# Patient Record
Sex: Female | Born: 1960
Health system: Southern US, Community
[De-identification: ages and names within clinical notes are randomized; demographics above are authoritative.]

## PROBLEM LIST (undated history)

## (undated) DIAGNOSIS — N951 Menopausal and female climacteric states: Secondary | ICD-10-CM

## (undated) DIAGNOSIS — E538 Deficiency of other specified B group vitamins: Secondary | ICD-10-CM

## (undated) DIAGNOSIS — E038 Other specified hypothyroidism: Secondary | ICD-10-CM

## (undated) DIAGNOSIS — K5909 Other constipation: Secondary | ICD-10-CM

## (undated) DIAGNOSIS — E039 Hypothyroidism, unspecified: Secondary | ICD-10-CM

## (undated) DIAGNOSIS — R002 Palpitations: Secondary | ICD-10-CM

## (undated) DIAGNOSIS — M7580 Other shoulder lesions, unspecified shoulder: Secondary | ICD-10-CM

## (undated) DIAGNOSIS — Z803 Family history of malignant neoplasm of breast: Secondary | ICD-10-CM

## (undated) DIAGNOSIS — K219 Gastro-esophageal reflux disease without esophagitis: Secondary | ICD-10-CM

## (undated) DIAGNOSIS — M722 Plantar fascial fibromatosis: Secondary | ICD-10-CM

## (undated) DIAGNOSIS — E785 Hyperlipidemia, unspecified: Secondary | ICD-10-CM

## (undated) DIAGNOSIS — Z8481 Family history of carrier of genetic disease: Secondary | ICD-10-CM

## (undated) HISTORY — DX: Morbid (severe) obesity due to excess calories: E66.01

## (undated) HISTORY — PX: TONSILLECTOMY: SHX5217

## (undated) HISTORY — DX: Palpitations: R00.2

## (undated) HISTORY — DX: Menopausal and female climacteric states: N95.1

## (undated) HISTORY — DX: Gastro-esophageal reflux disease without esophagitis: K21.9

## (undated) HISTORY — DX: Family history of carrier of genetic disease: Z84.81

## (undated) HISTORY — DX: Hypothyroidism, unspecified: E03.9

## (undated) HISTORY — DX: Deficiency of other specified B group vitamins: E53.8

## (undated) HISTORY — DX: Other constipation: K59.09

## (undated) HISTORY — DX: Plantar fascial fibromatosis: M72.2

## (undated) HISTORY — DX: Other shoulder lesions, unspecified shoulder: M75.80

## (undated) HISTORY — DX: Other specified hypothyroidism: E03.8

## (undated) HISTORY — DX: Hyperlipidemia, unspecified: E78.5

## (undated) HISTORY — PX: COLONOSCOPY: SHX174

## (undated) HISTORY — DX: Family history of malignant neoplasm of breast: Z80.3

---

## 1999-09-04 ENCOUNTER — Other Ambulatory Visit: Admission: RE | Admit: 1999-09-04 | Discharge: 1999-09-04 | Payer: Self-pay | Admitting: Obstetrics and Gynecology

## 2000-09-24 ENCOUNTER — Other Ambulatory Visit: Admission: RE | Admit: 2000-09-24 | Discharge: 2000-09-24 | Payer: Self-pay | Admitting: Obstetrics and Gynecology

## 2001-02-04 ENCOUNTER — Ambulatory Visit (HOSPITAL_COMMUNITY): Admission: RE | Admit: 2001-02-04 | Discharge: 2001-02-04 | Payer: Self-pay | Admitting: Gastroenterology

## 2001-02-04 ENCOUNTER — Encounter: Payer: Self-pay | Admitting: Gastroenterology

## 2001-02-24 ENCOUNTER — Encounter (INDEPENDENT_AMBULATORY_CARE_PROVIDER_SITE_OTHER): Payer: Self-pay | Admitting: *Deleted

## 2001-02-24 ENCOUNTER — Ambulatory Visit (HOSPITAL_COMMUNITY): Admission: RE | Admit: 2001-02-24 | Discharge: 2001-02-24 | Payer: Self-pay | Admitting: Gastroenterology

## 2001-02-24 LAB — HM COLONOSCOPY

## 2001-09-27 ENCOUNTER — Other Ambulatory Visit: Admission: RE | Admit: 2001-09-27 | Discharge: 2001-09-27 | Payer: Self-pay | Admitting: Obstetrics and Gynecology

## 2002-04-05 ENCOUNTER — Observation Stay (HOSPITAL_COMMUNITY): Admission: RE | Admit: 2002-04-05 | Discharge: 2002-04-05 | Payer: Self-pay | Admitting: Surgery

## 2002-04-05 ENCOUNTER — Encounter (INDEPENDENT_AMBULATORY_CARE_PROVIDER_SITE_OTHER): Payer: Self-pay

## 2002-04-05 HISTORY — PX: CHOLECYSTECTOMY: SHX55

## 2002-08-16 ENCOUNTER — Encounter: Payer: Self-pay | Admitting: Obstetrics and Gynecology

## 2002-08-16 ENCOUNTER — Encounter: Admission: RE | Admit: 2002-08-16 | Discharge: 2002-08-16 | Payer: Self-pay | Admitting: Obstetrics and Gynecology

## 2002-10-12 ENCOUNTER — Other Ambulatory Visit: Admission: RE | Admit: 2002-10-12 | Discharge: 2002-10-12 | Payer: Self-pay | Admitting: Obstetrics and Gynecology

## 2003-08-18 ENCOUNTER — Encounter: Payer: Self-pay | Admitting: Obstetrics and Gynecology

## 2003-08-18 ENCOUNTER — Encounter: Admission: RE | Admit: 2003-08-18 | Discharge: 2003-08-18 | Payer: Self-pay | Admitting: Obstetrics and Gynecology

## 2003-10-27 ENCOUNTER — Other Ambulatory Visit: Admission: RE | Admit: 2003-10-27 | Discharge: 2003-10-27 | Payer: Self-pay | Admitting: Obstetrics and Gynecology

## 2004-06-11 ENCOUNTER — Encounter: Admission: RE | Admit: 2004-06-11 | Discharge: 2004-06-11 | Payer: Self-pay | Admitting: Gastroenterology

## 2004-09-12 ENCOUNTER — Encounter: Admission: RE | Admit: 2004-09-12 | Discharge: 2004-09-12 | Payer: Self-pay | Admitting: Obstetrics and Gynecology

## 2004-11-05 ENCOUNTER — Other Ambulatory Visit: Admission: RE | Admit: 2004-11-05 | Discharge: 2004-11-05 | Payer: Self-pay | Admitting: Obstetrics and Gynecology

## 2004-12-09 ENCOUNTER — Ambulatory Visit: Admission: RE | Admit: 2004-12-09 | Discharge: 2004-12-09 | Payer: Self-pay | Admitting: Obstetrics and Gynecology

## 2004-12-09 ENCOUNTER — Encounter (INDEPENDENT_AMBULATORY_CARE_PROVIDER_SITE_OTHER): Payer: Self-pay | Admitting: *Deleted

## 2004-12-09 HISTORY — PX: SALPINGOOPHORECTOMY: SHX82

## 2005-10-03 ENCOUNTER — Encounter: Admission: RE | Admit: 2005-10-03 | Discharge: 2005-10-03 | Payer: Self-pay | Admitting: Obstetrics and Gynecology

## 2005-11-07 ENCOUNTER — Other Ambulatory Visit: Admission: RE | Admit: 2005-11-07 | Discharge: 2005-11-07 | Payer: Self-pay | Admitting: Obstetrics and Gynecology

## 2006-05-26 ENCOUNTER — Inpatient Hospital Stay (HOSPITAL_COMMUNITY): Admission: RE | Admit: 2006-05-26 | Discharge: 2006-05-28 | Payer: Self-pay | Admitting: Obstetrics and Gynecology

## 2006-05-26 ENCOUNTER — Encounter (INDEPENDENT_AMBULATORY_CARE_PROVIDER_SITE_OTHER): Payer: Self-pay | Admitting: *Deleted

## 2006-05-26 HISTORY — PX: SALPINGOOPHORECTOMY: SHX82

## 2006-05-26 HISTORY — PX: ABDOMINAL HYSTERECTOMY: SHX81

## 2006-10-12 ENCOUNTER — Encounter: Admission: RE | Admit: 2006-10-12 | Discharge: 2006-10-12 | Payer: Self-pay | Admitting: Obstetrics and Gynecology

## 2007-10-20 ENCOUNTER — Encounter: Admission: RE | Admit: 2007-10-20 | Discharge: 2007-10-20 | Payer: Self-pay | Admitting: Obstetrics and Gynecology

## 2008-02-21 ENCOUNTER — Encounter: Admission: RE | Admit: 2008-02-21 | Discharge: 2008-02-21 | Payer: Self-pay | Admitting: Gastroenterology

## 2008-11-10 DIAGNOSIS — E538 Deficiency of other specified B group vitamins: Secondary | ICD-10-CM

## 2008-11-10 HISTORY — DX: Deficiency of other specified B group vitamins: E53.8

## 2009-05-29 ENCOUNTER — Encounter: Admission: RE | Admit: 2009-05-29 | Discharge: 2009-05-29 | Payer: Self-pay | Admitting: Obstetrics and Gynecology

## 2010-06-07 ENCOUNTER — Encounter: Admission: RE | Admit: 2010-06-07 | Discharge: 2010-06-07 | Payer: Self-pay | Admitting: Obstetrics and Gynecology

## 2010-06-10 LAB — HM PAP SMEAR: HM Pap smear: NORMAL

## 2010-07-10 ENCOUNTER — Ambulatory Visit: Payer: Self-pay | Admitting: Cardiovascular Disease

## 2010-09-06 ENCOUNTER — Ambulatory Visit: Payer: Self-pay | Admitting: Cardiovascular Disease

## 2010-09-17 ENCOUNTER — Ambulatory Visit (HOSPITAL_COMMUNITY): Admission: RE | Admit: 2010-09-17 | Discharge: 2010-09-17 | Payer: Self-pay | Admitting: Cardiovascular Disease

## 2010-09-17 ENCOUNTER — Ambulatory Visit: Payer: Self-pay

## 2010-09-17 ENCOUNTER — Encounter: Payer: Self-pay | Admitting: Cardiovascular Disease

## 2010-09-17 ENCOUNTER — Ambulatory Visit: Payer: Self-pay | Admitting: Cardiology

## 2011-03-28 NOTE — Op Note (Signed)
[facility]  Patient:    Jodi [name], Jodi [name] Visit Number: [id number] MRN: [id number]          Service Type: SUR Location: 3W [id number] [id number] Attending Physician:  Shelly Rubenstein Dictated by:   Abigail Miyamoto, M.D. Proc. Date: 04/05/02 Admit Date:  04/05/2002   CC:         Anselmo Rod, M.D.   Operative Report  PREOPERATIVE DIAGNOSIS:  Chronic abdominal pain.  POSTOPERATIVE DIAGNOSIS:  Chronic cholecystitis.  PROCEDURE:  Laparoscopic cholecystectomy.  SURGEON:  Douglas A. Magnus Ivan, M.D.  ASSISTANT:  Zigmund Daniel, M.D.  ANESTHESIA:  General endotracheal.  INDICATIONS:  Jodi [name] is a 50 year old female, who has had a several year history of right upper quadrant abdominal pain, which is made worse with fatty meals.  She has had an extensive work-up by a gastroenterologist, including ultrasound, HIDA scan, upper endoscopy, and CAT scan which were all unremarkable.  Secondary to her symptoms, the decision was made to proceed to the operating room for laparoscopy.  FINDINGS:  The patient was found to have a chronically infected gallbladder with multiple adhesions to it consistent with chronic cholecystitis.  PROCEDURE IN DETAIL:  The patient was brought to the operating room and identified as Jodi [name].  She was placed supine on the operating room table, and general anesthesia was induced.  Her abdomen was then prepped and draped in the usual sterile fashion.  Using a #15 blade, a small transverse incision was made below the umbilicus.  The incision was carried down to the fascia which was then opened with a scalpel.  A hemostat was then used to pass through the peritoneal cavity.  A 0 Vicryl pursestring suture was then placed around the fascial opening.  The Hasson port was placed through the opening, and insufflation of the abdomen was begun.  An 11 mm port was placed in the patients epigastrium, and two 5 mm ports were placed in  the patients right flank under direct vision.  The gallbladder was then retracted above the liver bed.  The patient was found to have multiple adhesions to the base of the gallbladder which were taken down with the electrocautery.  The gallbladder was found to be quite scarred and thick-walled.  The cystic duct and artery was then dissected out.  The artery was grossly adherent to the cystic stump and could not be peeled off.  Therefore, both were clipped together with three clips proximally and two clips distally and then transected with the scissors. Hemostasis appeared to be achieved.  The gallbladder was then dissected free from the liver bed with the electrocautery.  Once the gallbladder was removed, again the liver bed was examined, and hemostasis was achieved.  The gallbladder was then removed through the incision at the umbilicus.  The 0 Vicryl at the umbilicus was tied in place, closing the fascial defect.  The abdomen was then irrigated with normal saline.  Hemostasis again was achieved. All ports were then removed under direct vision, and the abdomen was deflated. All skin incisions were then anesthetized with 0.25% Marcaine and then closed with 4-0 Monocryl subcuticular sutures.  Steri-Strips, gauze, and tape were then applied.  The patient tolerated the procedure well.  All sponge, needle, and instrument counts were correct at the end of the procedure.  The patient was then extubated in the operating room and taken in stable condition to the recovery room. Dictated by:   Abigail Miyamoto, M.D. Attending Physician:  Shelly Rubenstein  DD:  04/05/02 TD:  04/05/02 Job: [id number]

## 2011-03-28 NOTE — Op Note (Signed)
NAME:  Jodi [name], Jodi [name]               ACCOUNT NO.:  [id number]   MEDICAL RECORD NO.:  [id number]          PATIENT TYPE:  INP   LOCATION:  9399                          FACILITY:  [facility]   PHYSICIAN:  Carrington Clamp, M.D. DATE OF BIRTH:  July 13, 1961   DATE OF PROCEDURE:  12/09/2004  DATE OF DISCHARGE:                                 OPERATIVE REPORT   PREOPERATIVE DIAGNOSIS:  Left complex ovarian cyst.   POSTOPERATIVE DIAGNOSIS:  Left corpus luteum cyst, hydrosalpinx,  endometriosis, and adhesions of epiploica to the left ovary.   PROCEDURE:  Diagnostic laparoscopy, with left salpingo-oophorectomy, cautery  of endometriosis, lysis of adhesions.   SURGEON:  Carrington Clamp, M.D.   ASSISTANT:  Luvenia Redden, M.D.   ANESTHESIA:  General.   SPECIMENS:  Left ovary and tube.   ESTIMATED BLOOD LOSS:  100 cc.   IV FLUIDS:  2,000 cc.   URINE OUTPUT:  450 cc.   COMPLICATIONS:  None.   FINDINGS:  A 3 cm left corpus luteum cyst with clear fluid noted on entry.  Left tube had hydrosalpinx and clubbing.  There is endometriosis in the left  cul-de-sac.  There was a normal right tube and ovary and otherwise normal  uterus.   MEDICATIONS:  Intercede.   COUNTS:  Correct x 3.   TECHNIQUE:  After adequate general anesthesia was achieved, the patient was  prepped and draped in the usual sterile fashion in the dorsal lithotomy  position.  A speculum was placed in the vagina, and a single-tooth tenaculum  placed on the cervix.  The Kahn cannula was placed into the cervical os and  the speculum removed.  Attention was then turned to the abdomen, where a 2  cm infraumbilical incision was made with a scalpel and then the Veress  needle passed through this without aspiration of bowel contents or blood.  The abdomen was insufflated and the 10 mm trocar placed.  Three 5 mm trocars  were then placed, one each bilaterally and one suprapubically, avoiding the  inferior epigastric arteries and  bladder.  The bladder had been drained with  a Foley catheter.  The above findings were noted.  The epiploica of the  sigmoid were grossly adhesed to the left ovarian cyst, and it was very  difficult to identify the infundibulopelvic ligament.  Endoshears were  passed into the abdomen, and Endoshears were used to carefully dissect the  epiploica off of the ovary.  The ovary was entered into at this point and  clear fluid noted.  The dissection was then carried toward the tube which  was then dissected off of the infundibulopelvic ligament.  Blunt and sharp  dissection were used both to identify these structures, and on the right  hand side the ureter was identified to be well below the infundibulopelvic  ligament, although it was difficult to identify the ureter on the left hand  side.  I believed that it was well out of the field of dissection.  In doing  the dissection, we entered the retroperitoneal space anyway, and it was  clear that the ureter was  not in that superficial area where we were  dissecting.   Once the infundibulopelvic ligament and the ovary were clear, the tube was  dissected with the triple polar cautery, following the mesosalpinx all the  way to the end.  The ovary was then removed by cauterizing the uteroovarian  ligament and again following the mesovarium all the way to the  infundibulopelvic ligament.  The infundibulopelvic ligament was cauterized  very medially as to avoid any pelvic sidewall structures, and the ovary was  able to be removed.  After the dissection had been carried out, hemostasis  was achieved, and again the ureter was not anywhere near the field of  dissection, nor was the bowel or any of the other pelvic sidewall  structures.   The left ovary and tube were then placed in an Endo bag and removed through  the 10 mm scope site.  Endometriosis was noted in the cul-de-sac, one area  on the broad ligament which was cauterized superficially, one area  close to  the uterosacral ligament which was cauterized superficially.  The other one  appeared to be too close to the area that the ureter would traverse and  therefore was left alone.  There was a small amount of oozing from the edges  of the dissection of the epiploica, and one area was briefly tented up and  cauterized to prevent any more bleeding.  A small piece of Intercede was  placed over this to ensure hemostasis and also to prevent any adhesions of  the small bowel to the sigmoid.  The pelvis had been irrigated vigorously  and then emptied of the irrigation.  All instruments were then withdrawn  from the abdomen and the abdomen desufflated.  The 10 mm trocar site fascia  was closed with a figure-of-eight stitch of 2-0 Vicryl.  The suprapubic skin  incisions were closed with subcuticular single stitches of 3-0 Vicryl  Rapide.  The incisions in the abdomen were closed with Dermabond.  All  instruments were withdrawn from the vagina, and a small amount of silver  nitrate was applied to the cervix to stem some bleeding from one of the  tenaculum sites.  The patient tolerated the procedure well and was returned  to the recovery room in stable condition.      MH/MEDQ  D:  12/09/2004  T:  12/09/2004  Job:  [id number]

## 2011-03-28 NOTE — Procedures (Signed)
[place]. [facility]  Patient:    Jodi [name], Jodi [name]                      MRN: [id number] Proc. Date: 02/24/01 Adm. Date:  [id number] Attending:  Charna Elizabeth CC:         Teena Irani. Arlyce Dice, M.D.                           Procedure Report  DATE OF BIRTH:  June 21, 1961  REFERRING PHYSICIAN:  Teena Irani. Arlyce Dice, M.D.  PROCEDURE PERFORMED:  Colonoscopy.  ENDOSCOPIST:  Anselmo Rod, M.D.  INSTRUMENT USED:  Olympus video colonoscope.  INDICATIONS FOR PROCEDURE:  Guaiac positive stool in a 50 year old female rule out colonic polyps, masses, hemorrhoids, etc.  PREPROCEDURE PREPARATION:  Informed consent was procured from the patient. The patient was fasted for eight hours prior to the procedure and prepped with a bottle of magnesium citrate and a gallon of NuLytely the night prior to the procedure.  PREPROCEDURE PHYSICAL:  The patient had stable vital signs.  Neck supple. Chest clear to auscultation.  S1, S2 regular.  Abdomen soft with normal abdominal bowel sounds.  DESCRIPTION OF PROCEDURE:  The patient was placed in the left lateral decubitus position and sedated with 20 mg of Demerol and 2 mg of Versed intravenously in addition to the medications she received for upper endoscopy. Once the patient was adequately sedated and maintained on low-flow oxygen and continuous cardiac monitoring, the Olympus video colonoscope was advanced from the rectum to the cecum without difficulty.  Except for small internal and external hemorrhoids, no other abnormalities were noticed up to the cecum and terminal ileum.  The entire colonic mucosa appeared healthy with normal vascular pattern.  No erosions, ulcerations, diverticula or other abnormalities were noticed.  The terminal ileum also appeared healthy.  IMPRESSION:  Healthy-appearing colon and terminal ileum except for small nonbleeding internal and external hemorrhoids.  RECOMMENDATIONS: 1. The patient has  been advised to increase the fluid and fiber in the diet. 2. Repeat guaiac testing will be done on an outpatient basis and further    recommendations made as needed.DD:  02/24/01 TD:  02/25/01 Job: [id number]

## 2011-03-28 NOTE — Discharge Summary (Signed)
NAMELUBERTHA, Jodi [name] NO.:  [id number]   MEDICAL RECORD NO.:  [id number]          PATIENT TYPE:  INP   LOCATION:  9312                          FACILITY:  [facility]   PHYSICIAN:  Carrington Clamp, M.D. DATE OF BIRTH:  1960-11-12   DATE OF ADMISSION:  05/26/2006  DATE OF DISCHARGE:  05/28/2006                                 DISCHARGE SUMMARY   ADMISSION DIAGNOSIS:  Left lower quadrant pain and possible ovarian mass.   DISCHARGE DIAGNOSIS:  Pelvic adhesions and endometriosis.   PROCEDURE:  Total abdominal hysterectomy with right salpingo-oophorectomy.   LABORATORY DATA:  Preoperative H&H 13 and 38.  Postoperative H&H 10 and 31.   HISTORY OF PRESENT ILLNESS:  Please refer to dictated history and physical  on chart.  Briefly, this is a 50 year old G3, P2 status post left  oophorectomy complaining of left lower quadrant pain and mass on left.   HOSPITAL COURSE:  Ms. Overholt was admitted on May 22, 2006 for the above-  named procedures.  Findings were 8-week size uterus with endometriosis and  adhesions.  There was no mass of the ovary seen at all.  The patient  underwent the operation without complication and was discharged on  postoperative day #2.   ACTIVITY:  Walk with assistance.  No driving for two weeks.  No lifting for  six weeks.  No sexual activity for six weeks.   DIET:  Increase water and fiber.   WOUND CARE:  Keep incision clean and dry.   DISCHARGE MEDICATIONS:  1. Darvocet-N 100 one p.o. q.4-6h. p.r.n. pain.  2. Tylenol over-the-counter.  3. Colace over-the-counter.  4. Mylanta Gas over-the-counter.   FOLLOW UP:  The patient was to follow up the following day for staples to be  removed in the office.      Carrington Clamp, M.D.  Electronically Signed     MH/MEDQ  D:  07/02/2006  T:  07/02/2006  Job:  [id number]

## 2011-03-28 NOTE — Op Note (Signed)
[place]. [facility]  Patient:    Jodi [name], Jodi [name]                      MRN: [id number] Proc. Date: 02/24/01 Adm. Date:  [id number] Attending:  Charna Elizabeth CC:         Teena Irani. Arlyce Dice, M.D.                           Operative Report  DATE OF BIRTH:  November 17, 1960  REFERRING PHYSICIAN:  Teena Irani. Arlyce Dice, M.D.  PROCEDURE PERFORMED:  Esophagogastroduodenoscopy with biopsies.  ENDOSCOPIST:  Anselmo Rod, M.D.  INSTRUMENT USED:  Olympus video panendoscope.  INDICATIONS FOR PROCEDURE:  A 50 year old white female with a history of epigastric pain and guaiac positive stools rule out peptic ulcer disease, esophagitis, gastritis, etc.  PREPROCEDURE PREPARATION:  Informed consent was procured from the patient. The patient was fasted for eight hours prior to the procedure.  PREPROCEDURE PHYSICAL:  The patient had stable vital signs.  Neck supple. Chest clear to auscultation.  S1, S2 regular.  Abdomen soft with normal abdominal bowel sounds.  DESCRIPTION OF PROCEDURE:  The patient was placed in left lateral decubitus position and sedated with 40 mg of Demerol and 6 mg of Versed intravenously. Once the patient was adequately sedated and maintained on low-flow oxygen and continuous cardiac monitoring, the Olympus video panendoscope was advanced through the mouthpiece, over the tongue, into the esophagus under direct vision.  The distal esophagus had a small patch of salmon-pink mucosa above the Z-line.  This area was biopsied to rule out Barretts by pathology.  The rest of the esophageal mucosa appeared healthy.  There was a small hiatal hernia seen on high retroflexion.  The rest of the gastric mucosa and the proximal small bowel appeared normal.  IMPRESSION: 1. Small patch of what appears to be Barretts mucosa in the distal esophagus.    Biopsies done, results pending. 2. Small hiatal hernia. 3. Normal-appearing stomach and proximal small  bowel.  RECOMMENDATIONS: 1. Await pathology results. 2. Continue proton pump inhibitors for now. 3. Avoid all nonsteroidals. 4. Outpatient follow-up in the next four weeks. DD:  02/24/01 TD:  02/25/01 Job: [id number]

## 2011-03-28 NOTE — Op Note (Signed)
NAMESELIN, [name] NO.:  [id number]   MEDICAL RECORD NO.:  [id number]          PATIENT TYPE:  INP   LOCATION:  9399                          FACILITY:  [facility]   PHYSICIAN:  Carrington Clamp, M.D. DATE OF BIRTH:  1961-01-07   DATE OF PROCEDURE:  05/26/2006  DATE OF DISCHARGE:                                 OPERATIVE REPORT   PREOPERATIVE DIAGNOSIS:  Left lower quadrant pain and possible left lower  quadrant mass.   POSTOPERATIVE DIAGNOSIS:  Adhesions and endometriosis.   PROCEDURE:  Total abdominal hysterectomy and right salpingo-oophorectomy.   SURGEON:  Carrington Clamp, M.D.   ASSISTANT:  Luvenia Redden, M.D.   ANESTHESIA:  General.   SPECIMENS:  Uterus, cervix and right ovary and tube.   ESTIMATED BLOOD LOSS:  200 mL.   IV FLUIDS:  1500 mL.   COMPLICATIONS:  None.   FINDINGS:  Eight weeks size uterus, small products of endometriosis were  seen along the uterus, the vesicouterine fascia, the anterior uterus, and  along the broad ligament.  There was no masses on the left hand side.  There  were some adhesions of the bowel to the peritoneum but otherwise no other  problems.  Right ovary and tube looked normal. Uterus was, otherwise,  normal.   MEDICATIONS:  Ancef.   COUNTS:  Correct x3.   TECHNIQUE:  After adequate general anesthesia was achieved, the patient was  prepped and draped in the usual sterile fashion in the dorsal supine  position.  A Pfannenstiel skin incision was made with the scalpel and  carried down to the fascia with Bovie cautery.  The fascia was incised in  the midline with the scalpel and carried in a transverse curvilinear manner  with the Mayo scissors.  The rectus muscles were split in the midline and  the bowel free portion of peritoneum was entered into with blunt and sharp  dissection.  The peritoneum was then incised in a superior and inferior  manner with Metzenbaum scissors with good visualization of bowel and  the  bladder.   The O'Connor-O'Sullivan retractor was placed and the bowel was packed away  with four wet laps.  Systematic inspection of the pelvis was then undertaken  and the above findings noted.  The cornua of the uterus was then grasped  with [employer] clamps.  The round ligaments were secured with a suture  transfixion stitch of 0 Vicryl and then divided with Bovie cautery.  Bovie  cautery was then used to incise the peritoneum superiorly perpendicular to  the infundibular pelvic ligament and inferiorly with creating the bladder  flap by incising the vesicouterine fascia.  The bladder flap was created  with blunt and sharp dissection and was eventually removed out of the field  with blunt dissection.  The infundibular pelvic ligaments were then isolated  and each grasped with a Heaney clamp.  Each pedicle was incised with the  Mayo scissors and then secured with a free hand tie of 0 Vicryl followed by  a stitch of 0 Vicryl.  The uterine arteries were then skeletonized and  Heaney  clamps were placed bilaterally at the level of the internal os.  Each  pedicle was incised with the scalpel and then secured with a stitch of 0  Vicryl.   Alternating successive bites of the Ballantine clamp was used to divide the  cardinal ligament.  Each pedicle was incised with the scalpel and then  secured with a stitch of 0 Vicryl.  The final bite on the left-hand side  actually got into the vagina at the level of the reflection of the vagina  and the cervix.  The cervix was grasped with the Kocher and then the uterine  specimen amputated with the Jorgenson scissors.  This had been done after  the ureters were seen coursing bilaterally outside the field of dissection  on both the right and left hand sides on multiple times.  The angles of the  cuff was then secured with a Heaney stitch of 0 Vicryl.  The cuff was closed  with interrupted figure-of-eight stitches of 0 Vicryl.  The ureters were  again  reinspected and found to be out of the field of dissection and  peristalsing bilaterally.   Irrigation was performed and all instruments were then withdrawn from the  abdomen.  The peritoneum was closed with a running stitch of 2-0 Vicryl.  The fascia was closed with a running stitch of 0 PDS looped.  The  subcutaneous tissue was rendered hemostatic with Bovie cautery and  irrigation.  The subcutaneous layer was closed with interrupted stitches of  2-0 plain gut.  The skin was closed staples.  The small amount of  endometriosis that had not been removed were in areas that could be  cauterized and were safely done so.  The pelvis was, otherwise, clear and  free of any masses.  The patient tolerated the procedure well and is taken  to the recovery room in stable condition.      Carrington Clamp, M.D.  Electronically Signed     MH/MEDQ  D:  05/26/2006  T:  05/26/2006  Job:  [id number]

## 2011-03-28 NOTE — H&P (Signed)
NAME:  Jodi [name], Jodi [name] NO.:  [id number]   MEDICAL RECORD NO.:  [id number]          PATIENT TYPE:  AMB   LOCATION:  SDC                           [facility]ACILITY:  [facility]   PHYSICIAN:  Carrington Clamp, M.D. DATE OF BIRTH:  08-20-1961   DATE OF ADMISSION:  05/26/2006  DATE OF DISCHARGE:                                HISTORY & PHYSICAL   CHIEF COMPLAINT:  This is a 50 year old G3, P2, status post left  oophorectomy, complaining of left lower quadrant pain and mass on left.   HISTORY OF PRESENT ILLNESS:  The patient presented to me in January of 2006  complaining of left lower quadrant pain and bleeding.  She underwent an  ultrasound that revealed a 3-cm complex cyst on the left and was taken for a  laparoscopic evaluation in January 2006.  She underwent a left salpingo-  oophorectomy and pathology was read out as hemorrhagic corpus luteum cyst.  The patient presented again in May 2007 complaining of a lot of pain on the  left-hand side again.  She continues to have heavy bleeding and severe pain  with urination.  The patient has had difficulty taking nonsteroidals in the  past secondary to a hiatal hernia and reflux and the pain was worsening.  Gas pain made it worse, felt like sharp squeezing and it was intermittent.  Bimanual exam at that time revealed an 8- to 9-week-size uterus with knobbly  cervix, but unable to feel any masses distinctly.  Ultrasound revealed  another mass on the left measuring 3.4 cm; it appears to be the left ovary.  The right ovary was otherwise normal in appearance and the uterus size was  10 x 6 x 5.  All risks, benefits and options were discussed with the patient  and we decided to proceed with total abdominal hysterectomy and bilateral  salpingo-oophorectomy.  The patient will have CA125, CA19.9 and CEA drawn at  preoperative labs.  However, given the benign nature of the mass and the  benign nature of the previous mass, it is unlikely that this  is cancer and  therefore we will proceed with a regular hysterectomy.  The patient will  receive a modified bowel prep before the surgery in order to help clear the  bowels.   PAST MEDICAL HISTORY:  Significant only for reflux.   PAST SURGICAL HISTORY:  1.  The patient has had her tonsils and gallbladder out.  2.  She also underwent the diagnostic laparoscopy with left salpingo-      oophorectomy in January 2006.   PAST OBSTETRICAL HISTORY:  Term spontaneous vaginal delivery x2.   PAST GYNECOLOGICAL HISTORY:  Negative for sexually transmitted diseases or  pelvic infections.   SOCIAL HISTORY:  Tobacco:  None.   MEDICATIONS:  1.  Protonix 40 mg one p.o. daily.  2.  Colace 100 mg b.i.d.   ALLERGIES:  No known drug allergies.   PHYSICAL EXAMINATION:  GENERAL:  Physical exam reveals a normal-appearing  female with no lymphadenopathy and anicteric.  NECK:  Without thyromegaly.  LUNGS:  Clear to auscultation bilaterally.  HEART:  Regular rate and rhythm.  ABDOMEN:  Soft, nontender and non-distended.  PELVIC:  Bimanual exam revealed 8- to 9-week-size uterus, anteverted, with a  possible mass on the left, but no distinct masses felt.  Normal external  genitalia and vagina and vault were otherwise seen.   ASSESSMENT:  This is a 50 year old gravida 3, para 2 with a recurrent left  mass on ultrasound and persistent left lower quadrant pain.  The patient  desires definitive therapy.  She will undergo an exploratory laparotomy with  a total abdominal hysterectomy and uni or bilateral salpingo-oophorectomy.  She will undergo a modified bowel prep preoperatively in order to clear the  bowels in the event that there are significant adhesions.  The patient will  receive preoperative antibiotics and sequential compression devices during  the procedure.  All risks, benefits and options have been discussed with the  patient.  The CA125 and CA19.9 and carcinoembryonic antigen were all pending   at the time of this dictation.  The patient wishes to proceed and  understands.      Carrington Clamp, M.D.  Electronically Signed     MH/MEDQ  D:  05/26/2006  T:  05/26/2006  Job:  [id number]

## 2011-06-09 ENCOUNTER — Encounter: Payer: Self-pay | Admitting: Family Medicine

## 2011-06-12 ENCOUNTER — Encounter: Payer: Self-pay | Admitting: Family Medicine

## 2011-06-12 ENCOUNTER — Ambulatory Visit (INDEPENDENT_AMBULATORY_CARE_PROVIDER_SITE_OTHER): Payer: 59 | Admitting: Family Medicine

## 2011-06-12 ENCOUNTER — Telehealth: Payer: Self-pay | Admitting: Family Medicine

## 2011-06-12 DIAGNOSIS — E538 Deficiency of other specified B group vitamins: Secondary | ICD-10-CM

## 2011-06-12 DIAGNOSIS — Z0000 Encounter for general adult medical examination without abnormal findings: Secondary | ICD-10-CM | POA: Insufficient documentation

## 2011-06-12 LAB — CBC WITH DIFFERENTIAL/PLATELET
Basophils Absolute: 0 K/uL (ref 0.0–0.1)
Basophils Relative: 0.9 % (ref 0.0–3.0)
Eosinophils Absolute: 0.1 K/uL (ref 0.0–0.7)
Eosinophils Relative: 1 % (ref 0.0–5.0)
HCT: 38.8 % (ref 36.0–46.0)
Hemoglobin: 13.1 g/dL (ref 12.0–15.0)
Lymphocytes Relative: 36.3 % (ref 12.0–46.0)
Lymphs Abs: 2 K/uL (ref 0.7–4.0)
MCHC: 33.6 g/dL (ref 30.0–36.0)
MCV: 96.8 fl (ref 78.0–100.0)
Monocytes Absolute: 0.4 K/uL (ref 0.1–1.0)
Monocytes Relative: 7.3 % (ref 3.0–12.0)
Neutro Abs: 3 K/uL (ref 1.4–7.7)
Neutrophils Relative %: 54.5 % (ref 43.0–77.0)
Platelets: 195 K/uL (ref 150.0–400.0)
RBC: 4.01 Mil/uL (ref 3.87–5.11)
RDW: 13.4 % (ref 11.5–14.6)
WBC: 5.6 K/uL (ref 4.5–10.5)

## 2011-06-12 LAB — COMPREHENSIVE METABOLIC PANEL
Alkaline Phosphatase: 70 U/L (ref 39–117)
CO2: 28 mEq/L (ref 19–32)
Calcium: 9.2 mg/dL (ref 8.4–10.5)
Chloride: 100 mEq/L (ref 96–112)
GFR: 77.71 mL/min (ref >60.00)
Glucose, Bld: 94 mg/dL (ref 70–99)
Total Protein: 7.4 g/dL (ref 6.0–8.3)

## 2011-06-12 LAB — VITAMIN B12: Vitamin B-12: 447 pg/mL (ref 211–911)

## 2011-06-12 LAB — LIPID PANEL
Cholesterol: 272 mg/dL — ABNORMAL HIGH (ref 0–200)
HDL: 61.6 mg/dL
Total CHOL/HDL Ratio: 4
Triglycerides: 138 mg/dL (ref 0.0–149.0)
VLDL: 27.6 mg/dL (ref 0.0–40.0)

## 2011-06-12 LAB — LDL CHOLESTEROL, DIRECT: Direct LDL: 188.3 mg/dL

## 2011-06-12 NOTE — Telephone Encounter (Signed)
Pls request records from Silas [place], FNP (her old office in [place]).  Thx--PM

## 2011-06-12 NOTE — Assessment & Plan Note (Signed)
Check B12 level to see where she stands on oral replacement only for the last 54mo or so.

## 2011-06-12 NOTE — Telephone Encounter (Signed)
Faxed request

## 2011-06-12 NOTE — Patient Instructions (Signed)
Buy OTC senakot S (generic) and take 2 tabs every night for constipation.

## 2011-06-12 NOTE — Assessment & Plan Note (Addendum)
Obesity is her primary issue and she is definitely working hard on lifestyle mod. Will check FLP today to see where she stands after having been off of simvastatin for months (or more). Also, will check CBC, CMET, and TSH today. Her GYN care is managed by her GYN MD.  Last pap was 2011 and she still apparently gets these q3 yrs since having her uterus removed for nonmalignant reasons.  Mammogram is UTD.   Will obtain old records.

## 2011-06-12 NOTE — Progress Notes (Signed)
Office Note 06/12/2011  CC:  Chief Complaint  Patient presents with  . Annual Exam    establish, fasting labs    HPI:  Jodi [name] is a 50 y.o. White female who is here to establish care and get CPE. Patient's most recent primary MD: Silas Sacramento, FNP in [place], [place]. Old records in [facility] were reviewed prior to or during today's visit.  No acute complaints.  Reviewed history. Has been on vit B12 injections about 2 yrs but since about 01/2011 has been only on OTC po daily replacement b/c her primary care provider's office closed.   Also, was on simvastatin 20mg  qd for months in the past but says it persistently caused fatigue and diffuse myalgias and this resolved when she d/c'd the med.   Also, got echo and saw Dr. Elease Hashimoto for palpitations (says exclusively when supine or sitting reclined) and was rx'd propranolol 10mg  to take qid prn for this and has only had to take it rarely.  Echo was normal.  No other testing done per pt report.  Past Medical History  Diagnosis Date  . Hemorrhoids 02/24/01    Colon; Dr. Loreta Ave  . GERD (gastroesophageal reflux disease)   . Palpitations     ECHO 09/2010 normal.  Rarely has to take 10mg  propranolol prn palpitations.  Marland Kitchen GERD (gastroesophageal reflux disease)     w/ hiatal hernia.  EGD 2002 (Dr. Loreta Ave) normal biopsy of esophagus  . Hyperlipidemia     intolerant of simvastatin  . Vitamin B12 deficiency 2010    Past Surgical History  Procedure Date  . Cholecystectomy 04/05/02    lap chole  . Tonsillectomy   . Abdominal hysterectomy 05/26/06    (pathology benign)  . Salpingoophorectomy 05/26/06    Right w/hysterectomy (pathology benign)  . Salpingoophorectomy 12/09/04    Left (pathology benign)    Family History  Problem Relation Age of Onset  . Diabetes Mother   . Heart disease Mother     CABG  . Hyperlipidemia Mother   . Hypertension Mother   . Cancer Father     lung  . Alcohol abuse Father   . Heart disease Maternal Grandmother     . Hyperlipidemia Maternal Grandmother   . Hypertension Maternal Grandmother   . Stroke Maternal Grandmother   . Diabetes Maternal Grandmother     History   Social History  . Marital Status: Married    Spouse Name: N/A    Number of Children: N/A  . Years of Education: N/A   Occupational History  . Not on file.   Social History Main Topics  . Smoking status: Never Smoker   . Smokeless tobacco: Never Used  . Alcohol Use: Not on file  . Drug Use: Not on file  . Sexually Active: Not on file   Other Topics Concern  . 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  Walks 1-3 miles daily.    Outpatient Encounter Prescriptions as of 06/12/2011  Medication Sig Dispense Refill  . estradiol (ESTRACE) 0.5 MG tablet Take 1 tablet by mouth daily.      . Omega-3 Fatty Acids (FISH OIL) 1200 MG CAPS Take 1 capsule by mouth daily.        Maxwell Caul Bicarbonate (ZEGERID OTC) 20-1100 MG CAPS Take 1 capsule by mouth daily.        . propranolol (  INDERAL) 10 MG tablet Take 10 mg by mouth 4 (four) times daily. To be taken qid AS NEEDED for palpitations as per Dr. Elease Hashimoto.       . vitamin B-12 (CYANOCOBALAMIN) 500 MCG tablet Take 500 mcg by mouth daily.          Allergies  Allergen Reactions  . Codeine Other (See Comments)    Skin crawls    ROS Review of Systems  Constitutional: Negative for fever, chills, appetite change and fatigue.  HENT: Negative for ear pain, congestion, sore throat, neck stiffness and dental problem.   Eyes: Negative for discharge, redness and visual disturbance.  Respiratory: Negative for cough, chest tightness, shortness of breath and wheezing.   Cardiovascular: Negative for chest pain, palpitations and leg swelling.  Gastrointestinal: Negative for nausea, vomiting, abdominal pain, diarrhea and blood in stool.  Genitourinary: Negative for  dysuria, urgency, frequency, hematuria, flank pain and difficulty urinating.  Musculoskeletal: Negative for myalgias, back pain, joint swelling and arthralgias.  Skin: Negative for pallor and rash.  Neurological: Negative for dizziness, speech difficulty, weakness and headaches.  Hematological: Negative for adenopathy. Does not bruise/bleed easily.  Psychiatric/Behavioral: Negative for confusion and sleep disturbance. The patient is not nervous/anxious.     PE; Blood pressure 123/83, pulse 66, height 5' 5.25" (1.657 m), weight 248 lb (112.492 kg), last menstrual period 05/10/2006, SpO2 98.00%. Gen: Alert, well appearing, overweight white female.  Patient is oriented to person, place, time, and situation. HEENT: Scalp without lesions or hair loss.  Ears: EACs clear, normal epithelium.  TMs with good light reflex and landmarks bilaterally.  Eyes: no injection, icteris, swelling, or exudate.  EOMI, PERRLA. Nose: no drainage or turbinate edema/swelling.  No injection or focal lesion.  Mouth: lips without lesion/swelling.  Oral mucosa pink and moist.  Dentition intact and without obvious caries or gingival swelling.  Oropharynx without erythema, exudate, or swelling.  Neck: supple, ROM full.  Carotids 2+ bilat, without bruit.  No lymphadenopathy, thyromegaly, or mass. Chest: symmetric expansion, nonlabored respirations.  Clear and equal breath sounds in all lung fields.   CV: RRR, no m/r/g.  Peripheral pulses 2+ and symmetric. ABD: soft, NT, ND, BS normal.  No hepatospenomegaly or mass.  No bruits. EXT: no clubbing, cyanosis, or edema.   Pertinent labs:  None today  ASSESSMENT AND PLAN:   Health maintenance examination Obesity is her primary issue and she is definitely working hard on lifestyle mod. Will check FLP today to see where she stands after having been off of simvastatin for months (or more). Also, will check CBC, CMET, and TSH today. Her GYN care is managed by her GYN MD.  Last pap  was 2011 and she still apparently gets these q3 yrs since having her uterus removed for nonmalignant reasons.  Mammogram is UTD.   Will obtain old records.  Vitamin B12 deficiency Check B12 level to see where she stands on oral replacement only for the last 43mo or so.     Return in about 1 year (around 06/11/2012).

## 2011-07-10 ENCOUNTER — Other Ambulatory Visit: Payer: Self-pay | Admitting: *Deleted

## 2011-07-10 ENCOUNTER — Other Ambulatory Visit: Payer: Self-pay | Admitting: Obstetrics and Gynecology

## 2011-07-10 NOTE — Telephone Encounter (Signed)
Faxed request received from pharmacy to refill simvastatin.  Pt restarted earlier this month.  I have attempted to contact this patient by phone with the following results: left message to return my call regarding simvastatin on answering machine (mobile).

## 2011-07-11 ENCOUNTER — Encounter: Payer: Self-pay | Admitting: Family Medicine

## 2011-07-11 MED ORDER — SIMVASTATIN 20 MG PO TABS: 20.0000 mg | ORAL_TABLET | Freq: Every day | ORAL | Status: DC

## 2011-07-11 NOTE — Telephone Encounter (Signed)
RC from pt.  She is doing well on simvastatin.  No side effects.  RX will be sent to pharm.

## 2011-11-14 ENCOUNTER — Telehealth: Payer: Self-pay | Admitting: *Deleted

## 2011-11-14 MED ORDER — SIMVASTATIN 20 MG PO TABS: 20.0000 mg | ORAL_TABLET | Freq: Every day | ORAL | Status: DC

## 2011-11-14 NOTE — Telephone Encounter (Signed)
New insurance, needs 90 day rx.  90 day supply sent.

## 2012-02-23 ENCOUNTER — Other Ambulatory Visit: Payer: Self-pay | Admitting: *Deleted

## 2012-02-23 MED ORDER — SIMVASTATIN 20 MG PO TABS: 20.0000 mg | ORAL_TABLET | Freq: Every day | ORAL | Status: DC

## 2012-02-23 NOTE — Telephone Encounter (Signed)
PC from patient requesting refill on simvastatin. Last filled by office 11/2011. Last seen 06/2011 Follow up needed 06/2012. RX sent to pharm.   Notified pt she will be due for CPE in August.  Encouraged to call for appt when she begins her last refill.  She is agreeable.

## 2012-04-07 ENCOUNTER — Encounter: Payer: Self-pay | Admitting: Family Medicine

## 2012-04-07 ENCOUNTER — Ambulatory Visit (INDEPENDENT_AMBULATORY_CARE_PROVIDER_SITE_OTHER): Payer: 59 | Admitting: Family Medicine

## 2012-04-07 VITALS — BP 129/87 | HR 59 | Temp 97.5°F | Ht 65.25 in | Wt 260.0 lb

## 2012-04-07 DIAGNOSIS — E669 Obesity, unspecified: Secondary | ICD-10-CM

## 2012-04-07 DIAGNOSIS — E785 Hyperlipidemia, unspecified: Secondary | ICD-10-CM

## 2012-04-07 DIAGNOSIS — E538 Deficiency of other specified B group vitamins: Secondary | ICD-10-CM

## 2012-04-07 DIAGNOSIS — I878 Other specified disorders of veins: Secondary | ICD-10-CM

## 2012-04-07 DIAGNOSIS — I872 Venous insufficiency (chronic) (peripheral): Secondary | ICD-10-CM

## 2012-04-07 LAB — CBC WITH DIFFERENTIAL/PLATELET
Basophils Absolute: 0 10*3/uL (ref 0.0–0.1)
Eosinophils Absolute: 0.1 10*3/uL (ref 0.0–0.7)
HCT: 36.6 % (ref 36.0–46.0)
Hemoglobin: 12.1 g/dL (ref 12.0–15.0)
Lymphs Abs: 2 10*3/uL (ref 0.7–4.0)
Monocytes Relative: 6.6 % (ref 3.0–12.0)
Neutro Abs: 3.1 10*3/uL (ref 1.4–7.7)
Platelets: 200 10*3/uL (ref 150.0–400.0)
RBC: 3.81 Mil/uL — ABNORMAL LOW (ref 3.87–5.11)
RDW: 13.6 % (ref 11.5–14.6)
WBC: 5.6 10*3/uL (ref 4.5–10.5)

## 2012-04-07 LAB — COMPREHENSIVE METABOLIC PANEL
AST: 23 U/L (ref 0–37)
Alkaline Phosphatase: 65 U/L (ref 39–117)
Calcium: 9.2 mg/dL (ref 8.4–10.5)
Chloride: 106 mEq/L (ref 96–112)
Creatinine, Ser: 0.8 mg/dL (ref 0.4–1.2)
GFR: 85.73 mL/min (ref >60.00)
Glucose, Bld: 91 mg/dL (ref 70–99)
Sodium: 141 mEq/L (ref 135–145)
Total Protein: 6.7 g/dL (ref 6.0–8.3)

## 2012-04-07 LAB — LIPID PANEL
Cholesterol: 209 mg/dL — ABNORMAL HIGH (ref 0–200)
HDL: 65.4 mg/dL (ref >39.00)
Triglycerides: 166 mg/dL — ABNORMAL HIGH (ref 0.0–149.0)
VLDL: 33.2 mg/dL (ref 0.0–40.0)

## 2012-04-07 LAB — TSH: TSH: 2.62 u[IU]/mL (ref 0.35–5.50)

## 2012-04-07 LAB — POCT URINALYSIS DIPSTICK: Bilirubin, UA: NEGATIVE

## 2012-04-07 LAB — VITAMIN B12: Vitamin B-12: 751 pg/mL (ref 211–911)

## 2012-04-07 MED ORDER — FUROSEMIDE 20 MG PO TABS: ORAL_TABLET | ORAL | Status: DC

## 2012-04-07 NOTE — Assessment & Plan Note (Addendum)
Venous insufficiency/lymphedema. Check UA for protein, check metabolic panel. Reassured pt of likely dx, general measures to counteract this noncurable condition. Low Na diet discussed, handout given, discussed occasional use of lasix when the legs feel the worst, elevation of legs prn, increase exercise.

## 2012-04-07 NOTE — Progress Notes (Signed)
OFFICE VISIT  04/07/2012   CC:  Chief Complaint  Patient presents with  . Foot Swelling    swelling in feet and ankles     HPI:    Patient is a 51 y.o. Caucasian female who presents for LE edema.  Last seen here 10 mo ago for CPE. Taking meds regularly. Wt is up 12 lb since last visit.   Noted onset of increased swelling in ankles and feet about 4 mo ago, gradual onset.  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  Worse at night, better when wakes up in A.m. Exercise: none Na intake: uses morton-light salt.  Does not otherwise watch Na intake.  Has felt that hands have shown a bit of fluid for a long time but never legs.  Denies SOB, DOE, CP, palpitations, or change in urine output. Denies prob from the statin we put her on after last lipid panel in august when LDL was 188.   Past Medical History  Diagnosis Date  . Hemorrhoids 02/24/01    Colonoscopy; Dr. Loreta Ave  . Palpitations     ECHO 09/2010 normal.  Rarely has to take 10mg  propranolol prn palpitations.  Marland Kitchen GERD (gastroesophageal reflux disease)     w/ hiatal hernia.  EGD 4/172002 (Dr. Loreta Ave) normal biopsy of esophagus  . Hyperlipidemia     intolerant of simvastatin  . Vitamin B12 deficiency 2010    IM therapy at one time but switched to oral in early 2012 and levels ok    Past Surgical History  Procedure Date  . Cholecystectomy 04/05/02    lap chole  . Tonsillectomy   . Abdominal hysterectomy 05/26/06    (pathology benign)  . Salpingoophorectomy 05/26/06    Right w/hysterectomy (pathology benign)  . Salpingoophorectomy 12/09/04    Left (pathology benign)    Outpatient Prescriptions Prior to Visit  Medication Sig Dispense Refill  . estradiol (ESTRACE) 0.5 MG tablet Take 1 tablet by mouth daily.      . Omega-3 Fatty Acids (FISH OIL) 1200 MG CAPS Take 1 capsule by mouth daily.        . simvastatin (ZOCOR) 20 MG tablet Take 1 tablet (20 mg total) by mouth at bedtime.  90 tablet  1  . vitamin  B-12 (CYANOCOBALAMIN) 500 MCG tablet Take 500 mcg by mouth daily.        Maxwell Caul Bicarbonate (ZEGERID OTC) 20-1100 MG CAPS Take 1 capsule by mouth daily.        . propranolol (INDERAL) 10 MG tablet Take 10 mg by mouth 4 (four) times daily. To be taken qid AS NEEDED for palpitations as per Dr. Elease Hashimoto.         Allergies  Allergen Reactions  . Codeine Other (See Comments)    Skin crawls    ROS As per HPI  PE: Blood pressure 129/87, pulse 59, temperature 97.5 F (36.4 C), temperature source Temporal, height 5' 5.25" (1.657 m), weight 260 lb (117.935 kg), last menstrual period 05/10/2006. Gen: Alert, well appearing.  Patient is oriented to person, place, time, and situation. Neck - No masses or thyromegaly or limitation in range of motion CV: RRR, no m/r/g.   LUNGS: CTA bilat, nonlabored resps, good aeration in all lung fields. ABD: soft, NT/ND but rotund EXT: doughy edema palpable in LE's from about mid tibial level down into feet bilat, no pitting, no tenderness or rash or varicosities.  No convincing pitting edema is found.  LABS:  UA today was normal except small blood.  IMPRESSION AND PLAN:  Lower extremity venous stasis Venous insufficiency/lymphedema. Check UA for protein, check metabolic panel. Reassured pt of likely dx, general measures to counteract this noncurable condition. Low Na diet discussed, handout given, discussed occasional use of lasix when the legs feel the worst, elevation of legs prn, increase exercise.  Hyperlipidemia Recheck fasting lipid panel today.  Vitamin B12 deficiency Check vit B12 level today.  Her level has been fine on oral supplement only.   CBC, TSH, and CMET done today as prep for upcoming CPE.  FOLLOW UP: Return for 2 mo for CPE.

## 2012-04-07 NOTE — Assessment & Plan Note (Signed)
Check vit B12 level today.  Her level has been fine on oral supplement only.

## 2012-04-07 NOTE — Assessment & Plan Note (Signed)
Recheck fasting lipid panel today 

## 2012-04-08 LAB — LDL CHOLESTEROL, DIRECT: Direct LDL: 128.6 mg/dL

## 2012-06-14 ENCOUNTER — Other Ambulatory Visit (INDEPENDENT_AMBULATORY_CARE_PROVIDER_SITE_OTHER): Payer: 59

## 2012-06-14 DIAGNOSIS — Z0000 Encounter for general adult medical examination without abnormal findings: Secondary | ICD-10-CM

## 2012-06-14 LAB — HEPATIC FUNCTION PANEL
AST: 19 U/L (ref 0–37)
Albumin: 3.6 g/dL (ref 3.5–5.2)
Alkaline Phosphatase: 68 U/L (ref 39–117)
Total Protein: 6.9 g/dL (ref 6.0–8.3)

## 2012-06-14 LAB — RENAL FUNCTION PANEL
Calcium: 9.2 mg/dL (ref 8.4–10.5)
Chloride: 105 mEq/L (ref 96–112)
Creatinine, Ser: 0.8 mg/dL (ref 0.4–1.2)
GFR: 76.33 mL/min (ref >60.00)
Glucose, Bld: 89 mg/dL (ref 70–99)
Potassium: 4.2 mEq/L (ref 3.5–5.1)

## 2012-06-14 LAB — LIPID PANEL
Cholesterol: 240 mg/dL — ABNORMAL HIGH (ref 0–200)
HDL: 62.4 mg/dL (ref >39.00)
VLDL: 33.2 mg/dL (ref 0.0–40.0)

## 2012-06-14 LAB — POCT URINALYSIS DIPSTICK
Glucose, UA: NEGATIVE
Nitrite, UA: NEGATIVE
Urobilinogen, UA: 0.2

## 2012-06-14 LAB — CBC
MCHC: 33.2 g/dL (ref 30.0–36.0)
Platelets: 204 10*3/uL (ref 150.0–400.0)
RDW: 13.5 % (ref 11.5–14.6)

## 2012-06-15 LAB — TSH: TSH: 3.39 u[IU]/mL (ref 0.35–5.50)

## 2012-06-15 LAB — LDL CHOLESTEROL, DIRECT: Direct LDL: 160.3 mg/dL

## 2012-06-17 ENCOUNTER — Ambulatory Visit (INDEPENDENT_AMBULATORY_CARE_PROVIDER_SITE_OTHER): Payer: 59 | Admitting: Family Medicine

## 2012-06-17 ENCOUNTER — Encounter: Payer: Self-pay | Admitting: Family Medicine

## 2012-06-17 VITALS — BP 105/76 | HR 81 | Ht 65.25 in | Wt 256.0 lb

## 2012-06-17 DIAGNOSIS — E785 Hyperlipidemia, unspecified: Secondary | ICD-10-CM

## 2012-06-17 DIAGNOSIS — Z0000 Encounter for general adult medical examination without abnormal findings: Secondary | ICD-10-CM

## 2012-06-17 NOTE — Assessment & Plan Note (Signed)
Reviewed age and gender appropriate health maintenance issues (prudent diet, regular exercise, health risks of tobacco and excessive alcohol, use of seatbelts, fire alarms in home, use of sunscreen).  Also reviewed age and gender appropriate health screening as well as vaccine recommendations. We'll get the date of her Tdap which she said she got through the [facility] system within the last 8 yrs.

## 2012-06-17 NOTE — Progress Notes (Signed)
Office Note 06/17/2012  CC:  Chief Complaint  Patient presents with  . Annual Exam    no problems, discuss labs drawn on Monday    HPI:  Jodi [name] is a 51 y.o. White female who is health maintenance exam.  She gets her GYN care through her GYN MD.  We reviewed the labs she had last week and these were all normal except her LDL was back up to 160 compared to 128 a few months ago. We discussed options today and she wants to get more aggressive with lifestyle mod rather than increase dose of simvastatin at this time, and recheck lipids in 28mo. NO acute complaints or questions at this time.  Past Medical History  Diagnosis Date  . Hemorrhoids 02/24/01    Colonoscopy; Dr. Loreta Ave  . Palpitations     ECHO 09/2010 normal.  Rarely has to take 10mg  propranolol prn palpitations.  Marland Kitchen GERD (gastroesophageal reflux disease)     w/ hiatal hernia.  EGD 4/172002 (Dr. Loreta Ave) normal biopsy of esophagus  . Hyperlipidemia     intolerant of simvastatin  . Vitamin B12 deficiency 2010    IM therapy at one time but switched to oral in early 2012 and levels ok    Past Surgical History  Procedure Date  . Cholecystectomy 04/05/02    lap chole  . Tonsillectomy   . Abdominal hysterectomy 05/26/06    (pathology benign)  . Salpingoophorectomy 05/26/06    Right w/hysterectomy (pathology benign)  . Salpingoophorectomy 12/09/04    Left (pathology benign)    Family History  Problem Relation Age of Onset  . Diabetes Mother   . Heart disease Mother     CABG  . Hyperlipidemia Mother   . Hypertension Mother   . Cancer Father     lung  . Alcohol abuse Father   . Heart disease Maternal Grandmother   . Hyperlipidemia Maternal Grandmother   . Hypertension Maternal Grandmother   . Stroke Maternal Grandmother   . Diabetes Maternal Grandmother     History   Social History  . Marital Status: Married    Spouse Name: N/A    Number of Children: N/A  . Years of Education: N/A   Occupational History    . Not on file.   Social History Main Topics  . Smoking status: Never Smoker   . Smokeless tobacco: Never Used  . Alcohol Use: Not on file  . Drug Use: Not on file  . Sexually Active: Not on file   Other Topics Concern  . 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  Walks 1-3 miles daily.    Outpatient Prescriptions Prior to Visit  Medication Sig Dispense Refill  . estradiol (ESTRACE) 0.5 MG tablet Take 1 tablet by mouth daily.      . furosemide (LASIX) 20 MG tablet 1 tab po qd prn for no more than 3 consecutive days for lower extremity swelling  10 tablet  3  . Omega-3 Fatty Acids (FISH OIL) 1200 MG CAPS Take 1 capsule by mouth daily.        Marland Kitchen omeprazole (PRILOSEC) 20 MG capsule Take 20 mg by mouth daily.      Marland Kitchen RASPBERRY PO Take 1 tablet by mouth 2 (two) times daily.      . simvastatin (ZOCOR) 20 MG tablet Take 1 tablet (20 mg total) by  mouth at bedtime.  90 tablet  1  . vitamin B-12 (CYANOCOBALAMIN) 500 MCG tablet Take 500 mcg by mouth daily.          Allergies  Allergen Reactions  . Codeine Other (See Comments)    Skin crawls    ROS Review of Systems  Constitutional: Negative for fever, chills, appetite change and fatigue.  HENT: Negative for ear pain, congestion, sore throat, neck stiffness and dental problem.   Eyes: Negative for discharge, redness and visual disturbance.  Respiratory: Negative for cough, chest tightness, shortness of breath and wheezing.   Cardiovascular: Negative for chest pain, palpitations and leg swelling.  Gastrointestinal: Negative for nausea, vomiting, abdominal pain, diarrhea and blood in stool.  Genitourinary: Negative for dysuria, urgency, frequency, hematuria, flank pain and difficulty urinating.  Musculoskeletal: Negative for myalgias, back pain, joint swelling and arthralgias.  Skin: Negative for pallor and rash.   Neurological: Negative for dizziness, speech difficulty, weakness and headaches.  Hematological: Negative for adenopathy. Does not bruise/bleed easily.  Psychiatric/Behavioral: Negative for confusion and disturbed wake/sleep cycle. The patient is not nervous/anxious.     PE; Blood pressure 105/76, pulse 81, height 5' 5.25" (1.657 m), weight 256 lb (116.121 kg), last menstrual period 05/10/2006. Gen: Alert, well appearing.  Patient is oriented to person, place, time, and situation. Affect: pleasant, lucid thought and speech. ENT: Ears: EACs clear, normal epithelium.  TMs with good light reflex and landmarks bilaterally.  Eyes: no injection, icteris, swelling, or exudate.  EOMI, PERRLA. Nose: no drainage or turbinate edema/swelling.  No injection or focal lesion.  Mouth: lips without lesion/swelling.  Oral mucosa pink and moist.  Dentition intact and without obvious caries or gingival swelling.  Oropharynx without erythema, exudate, or swelling.  Neck: supple/nontender.  No LAD, mass, or TM.  Carotid pulses 2+ bilaterally, without bruits. CV: RRR, no m/r/g.   LUNGS: CTA bilat, nonlabored resps, good aeration in all lung fields. ABD: soft, NT, ND, BS normal.  No hepatospenomegaly or mass.  No bruits. EXT: no clubbing, cyanosis, or edema.  Skin - no sores or suspicious lesions or rashes or color changes Neuro: CN 2-12 intact bilaterally, strength 5/5 in proximal and distal upper extremities and lower extremities bilaterally.  No sensory deficits.  No tremor.  No disdiadochokinesis.  No ataxia.  Upper extremity and lower extremity DTRs symmetric.  No pronator drift.  Pertinent labs:  Lab Results  Component Value Date   TSH 3.39 06/14/2012   Lab Results  Component Value Date   WBC 5.7 06/14/2012   HGB 12.4 06/14/2012   HCT 37.5 06/14/2012   MCV 97.3 06/14/2012   PLT 204.0 06/14/2012   Lab Results  Component Value Date   CREATININE 0.8 06/14/2012   BUN 15 06/14/2012   NA 139 06/14/2012   K 4.2 06/14/2012    CL 105 06/14/2012   CO2 27 06/14/2012   Lab Results  Component Value Date   ALT 21 06/14/2012   AST 19 06/14/2012   ALKPHOS 68 06/14/2012   BILITOT 0.6 06/14/2012   Lab Results  Component Value Date   CHOL 240* 06/14/2012   Lab Results  Component Value Date   HDL 62.40 06/14/2012   No results found for this basename: [facility]   Lab Results  Component Value Date   TRIG 166.0* 06/14/2012   Lab Results  Component Value Date   CHOLHDL 4 06/14/2012   No results found for this basename: PSA    ASSESSMENT AND PLAN:   Health  maintenance examination Reviewed age and gender appropriate health maintenance issues (prudent diet, regular exercise, health risks of tobacco and excessive alcohol, use of seatbelts, fire alarms in home, use of sunscreen).  Also reviewed age and gender appropriate health screening as well as vaccine recommendations. We'll get the date of her Tdap which she said she got through the [facility] system within the last 8 yrs.   Hyperlipidemia Discussed options and ended up keeping simvastatin at 20mg  qd and she wants to focus more on lifestyle modifications. Recheck FLP in 82mo (lab visit).  Next o/v is for CPE 1 yr from now.    FOLLOW UP:  Return for Lab visit in 82mo for fasting lipid panel (ordered).  Then o/v with me in 1 yr for CPE.Marland Kitchen

## 2012-06-17 NOTE — Assessment & Plan Note (Signed)
Discussed options and ended up keeping simvastatin at 20mg  qd and she wants to focus more on lifestyle modifications. Recheck FLP in 36mo (lab visit).  Next o/v is for CPE 1 yr from now.

## 2012-07-19 ENCOUNTER — Emergency Department (HOSPITAL_BASED_OUTPATIENT_CLINIC_OR_DEPARTMENT_OTHER): Payer: No Typology Code available for payment source

## 2012-07-19 ENCOUNTER — Emergency Department (HOSPITAL_BASED_OUTPATIENT_CLINIC_OR_DEPARTMENT_OTHER)
Admission: EM | Admit: 2012-07-19 | Discharge: 2012-07-19 | Disposition: A | Discharge status: Home / Self Care | Payer: No Typology Code available for payment source | Attending: Emergency Medicine | Admitting: Emergency Medicine

## 2012-07-19 ENCOUNTER — Encounter (HOSPITAL_BASED_OUTPATIENT_CLINIC_OR_DEPARTMENT_OTHER): Payer: Self-pay | Admitting: *Deleted

## 2012-07-19 DIAGNOSIS — Z885 Allergy status to narcotic agent status: Secondary | ICD-10-CM | POA: Insufficient documentation

## 2012-07-19 DIAGNOSIS — Z8489 Family history of other specified conditions: Secondary | ICD-10-CM | POA: Insufficient documentation

## 2012-07-19 DIAGNOSIS — Z833 Family history of diabetes mellitus: Secondary | ICD-10-CM | POA: Insufficient documentation

## 2012-07-19 DIAGNOSIS — K219 Gastro-esophageal reflux disease without esophagitis: Secondary | ICD-10-CM | POA: Insufficient documentation

## 2012-07-19 DIAGNOSIS — S20219A Contusion of unspecified front wall of thorax, initial encounter: Secondary | ICD-10-CM

## 2012-07-19 DIAGNOSIS — Z8249 Family history of ischemic heart disease and other diseases of the circulatory system: Secondary | ICD-10-CM | POA: Insufficient documentation

## 2012-07-19 DIAGNOSIS — Z6379 Other stressful life events affecting family and household: Secondary | ICD-10-CM | POA: Insufficient documentation

## 2012-07-19 DIAGNOSIS — Z823 Family history of stroke: Secondary | ICD-10-CM | POA: Insufficient documentation

## 2012-07-19 DIAGNOSIS — Z801 Family history of malignant neoplasm of trachea, bronchus and lung: Secondary | ICD-10-CM | POA: Insufficient documentation

## 2012-07-19 MED ORDER — ACETAMINOPHEN 325 MG PO TABS
650.0000 mg | ORAL_TABLET | Freq: Once | ORAL | Status: AC
  Administered 2012-07-19: 650 mg via ORAL
  Filled 2012-07-19: qty 2
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
## 2012-07-19 NOTE — ED Notes (Signed)
Vital signs stable. 
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
## 2012-07-19 NOTE — ED Notes (Signed)
MD at bedside. 

## 2012-09-02 ENCOUNTER — Other Ambulatory Visit: Payer: Self-pay | Admitting: *Deleted

## 2012-09-02 ENCOUNTER — Ambulatory Visit (INDEPENDENT_AMBULATORY_CARE_PROVIDER_SITE_OTHER): Payer: 59 | Admitting: Family Medicine

## 2012-09-02 ENCOUNTER — Encounter: Payer: Self-pay | Admitting: Family Medicine

## 2012-09-02 VITALS — BP 127/82 | HR 73 | Ht 65.25 in | Wt 261.0 lb

## 2012-09-02 DIAGNOSIS — M67919 Unspecified disorder of synovium and tendon, unspecified shoulder: Secondary | ICD-10-CM

## 2012-09-02 DIAGNOSIS — M7580 Other shoulder lesions, unspecified shoulder: Secondary | ICD-10-CM

## 2012-09-02 HISTORY — DX: Other shoulder lesions, unspecified shoulder: M75.80

## 2012-09-02 MED ORDER — SIMVASTATIN 20 MG PO TABS: 20.0000 mg | ORAL_TABLET | Freq: Every day | ORAL | Status: DC

## 2012-09-02 MED ORDER — METHYLPREDNISOLONE ACETATE 80 MG/ML IJ SUSP: 80.0000 mg | Freq: Once | INTRAMUSCULAR | Status: DC

## 2012-09-02 NOTE — Telephone Encounter (Signed)
Refill request for simvastatin  Last filled-02/23/12, #90 x 1 Last seen- 09/02/12 Follow up - Refill sent per [facility] refill protocol.

## 2012-09-02 NOTE — Assessment & Plan Note (Signed)
Right side.  Pt agreeable to steroid injection in joint today. Discussed addition of PT if this is of only minimal help. No rx's given today.
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
## 2012-12-17 ENCOUNTER — Other Ambulatory Visit (INDEPENDENT_AMBULATORY_CARE_PROVIDER_SITE_OTHER): Payer: 59

## 2012-12-17 DIAGNOSIS — E785 Hyperlipidemia, unspecified: Secondary | ICD-10-CM

## 2012-12-17 LAB — LIPID PANEL
HDL: 53.9 mg/dL (ref >39.00)
Triglycerides: 213 mg/dL — ABNORMAL HIGH (ref 0.0–149.0)
VLDL: 42.6 mg/dL — ABNORMAL HIGH (ref 0.0–40.0)

## 2012-12-17 NOTE — Progress Notes (Signed)
Labs only

## 2012-12-25 ENCOUNTER — Other Ambulatory Visit: Payer: Self-pay

## 2013-01-28 ENCOUNTER — Encounter: Payer: Self-pay | Admitting: Family Medicine

## 2013-03-01 ENCOUNTER — Telehealth: Payer: Self-pay | Admitting: *Deleted

## 2013-03-01 MED ORDER — SIMVASTATIN 20 MG PO TABS: 20.0000 mg | ORAL_TABLET | Freq: Every day | ORAL | Status: DC

## 2013-03-01 NOTE — Telephone Encounter (Signed)
Rx request to pharmacy; pt informed/SLS  

## 2013-03-04 ENCOUNTER — Other Ambulatory Visit: Payer: Self-pay | Admitting: Family Medicine

## 2013-03-04 NOTE — Telephone Encounter (Signed)
Re-sent to pharmacy.

## 2013-03-16 LAB — HM COLONOSCOPY

## 2013-03-18 ENCOUNTER — Encounter: Payer: Self-pay | Admitting: Family Medicine

## 2013-06-23 ENCOUNTER — Ambulatory Visit (INDEPENDENT_AMBULATORY_CARE_PROVIDER_SITE_OTHER): Payer: 59 | Admitting: Family Medicine

## 2013-06-23 ENCOUNTER — Encounter: Payer: Self-pay | Admitting: Family Medicine

## 2013-06-23 VITALS — BP 133/79 | HR 70 | Temp 97.8°F | Resp 16 | Ht 65.0 in | Wt 261.0 lb

## 2013-06-23 DIAGNOSIS — Z0000 Encounter for general adult medical examination without abnormal findings: Secondary | ICD-10-CM

## 2013-06-23 LAB — CBC WITH DIFFERENTIAL/PLATELET
Basophils Absolute: 0 10*3/uL (ref 0.0–0.1)
Eosinophils Absolute: 0 10*3/uL (ref 0.0–0.7)
HCT: 36.7 % (ref 36.0–46.0)
Lymphs Abs: 2.3 10*3/uL (ref 0.7–4.0)
MCHC: 33.7 g/dL (ref 30.0–36.0)
MCV: 95.9 fl (ref 78.0–100.0)
Monocytes Absolute: 0.3 10*3/uL (ref 0.1–1.0)
Neutrophils Relative %: 56.4 % (ref 43.0–77.0)
Platelets: 224 10*3/uL (ref 150.0–400.0)
RDW: 13.5 % (ref 11.5–14.6)

## 2013-06-23 LAB — COMPREHENSIVE METABOLIC PANEL
AST: 18 U/L (ref 0–37)
Albumin: 3.7 g/dL (ref 3.5–5.2)
BUN: 15 mg/dL (ref 6–23)
CO2: 29 mEq/L (ref 19–32)
Calcium: 9.2 mg/dL (ref 8.4–10.5)
Chloride: 100 mEq/L (ref 96–112)
Creatinine, Ser: 0.8 mg/dL (ref 0.4–1.2)
GFR: 86.63 mL/min (ref >60.00)
Glucose, Bld: 86 mg/dL (ref 70–99)
Potassium: 4.5 mEq/L (ref 3.5–5.1)

## 2013-06-23 LAB — LIPID PANEL
Cholesterol: 229 mg/dL — ABNORMAL HIGH (ref 0–200)
VLDL: 29.6 mg/dL (ref 0.0–40.0)

## 2013-06-23 LAB — TSH: TSH: 3.39 u[IU]/mL (ref 0.35–5.50)

## 2013-06-23 NOTE — Patient Instructions (Addendum)

## 2013-06-23 NOTE — Progress Notes (Addendum)
Office Note 06/23/2013  CC:  Chief Complaint  Patient presents with  . Annual Exam    HPI:  Jodi [name] is a 52 y.o. White female who is here for CPE. She gets mammograms through GYN: Dr. Mikki Harbor do them on site and I don't have record of 2013 mammo but she had it and it was normal.  Also had colonoscopy 03/2013 at Dr. Kenna Gilbert and pt says it was normal and she is to get repeat in 10 yrs. No acute complaints.    Past Medical History  Diagnosis Date  . Hemorrhoids 02/24/01    Colonoscopy; Dr. Loreta Ave  . Palpitations     ECHO 09/2010 normal.  Rarely has to take 10mg  propranolol prn palpitations.  Marland Kitchen GERD (gastroesophageal reflux disease)     w/ hiatal hernia.  EGD 4/172002 (Dr. Loreta Ave) normal biopsy of esophagus  . Hyperlipidemia     intolerant of simvastatin  . Vitamin B12 deficiency 2010    IM therapy at one time but switched to oral in early 2012 and levels ok  . Rotator cuff tendonitis 09/02/2012  Chronic constipation  Past Surgical History  Procedure Laterality Date  . Cholecystectomy  04/05/02    lap chole  . Tonsillectomy    . Abdominal hysterectomy  05/26/06    (pathology benign)  . Salpingoophorectomy  05/26/06    Right w/hysterectomy (pathology benign)  . Salpingoophorectomy  12/09/04    Left (pathology benign)  . Colonoscopy  2002; 2014    Int/ext hemorr, o/w normal.  Normal repeat colonoscopy 03/16/13 by Dr. Loreta Ave.    Family History  Problem Relation Age of Onset  . Diabetes Mother   . Heart disease Mother     CABG  . Hyperlipidemia Mother   . Hypertension Mother   . Cancer Father     lung  . Alcohol abuse Father   . Heart disease Maternal Grandmother   . Hyperlipidemia Maternal Grandmother   . Hypertension Maternal Grandmother   . Stroke Maternal Grandmother   . Diabetes Maternal Grandmother     History   Social History  . Marital Status: Married    Spouse Name: N/A    Number of Children: N/A  . Years of Education: N/A   Occupational  History  . Not on file.   Social History Main Topics  . Smoking status: Never Smoker   . Smokeless tobacco: Never Used  . Alcohol Use: No  . Drug Use: Not on file  . Sexual Activity: Yes    Birth Control/ Protection: Surgical   Other Topics Concern  . Not on file   Social History Narrative   Married, 2 children, 3 grandchildren.   Works as a Engineer, civil (consulting) for [employer], also part time in [facility] and [facility] ED.   No T/A/Ds.  Walks 1-3 miles daily.    Outpatient Prescriptions Prior to Visit  Medication Sig Dispense Refill  . estradiol (ESTRACE) 0.5 MG tablet Take 1 tablet by mouth daily.      . Omega-3 Fatty Acids (FISH OIL) 1200 MG CAPS Take 1 capsule by mouth daily.        Marland Kitchen omeprazole (PRILOSEC) 20 MG capsule Take 20 mg by mouth daily.      . simvastatin (ZOCOR) 20 MG tablet Take 1 tablet (20 mg total) by mouth at bedtime.  90 tablet  1  . vitamin B-12 (CYANOCOBALAMIN) 500 MCG tablet Take 500 mcg by mouth daily.        Marland Kitchen RASPBERRY PO Take  1 tablet by mouth 2 (two) times daily.      . simvastatin (ZOCOR) 20 MG tablet Take 1 tablet (20 mg total) by mouth at bedtime.  90 tablet  1   No facility-administered medications prior to visit.  2 OTC stool softener tabs qhs  Allergies  Allergen Reactions  . Codeine Other (See Comments)    Skin crawls    ROS Review of Systems  Constitutional: Negative for fever, chills, appetite change and fatigue.  HENT: Negative for ear pain, congestion, sore throat, neck stiffness and dental problem.   Eyes: Negative for discharge, redness and visual disturbance.  Respiratory: Negative for cough, chest tightness, shortness of breath and wheezing.   Cardiovascular: Negative for chest pain, palpitations and leg swelling.  Gastrointestinal: Negative for nausea, vomiting, abdominal pain, diarrhea and blood in stool.  Genitourinary: Negative for dysuria, urgency, frequency, hematuria, flank pain and difficulty urinating.  Musculoskeletal: Negative for  myalgias, back pain, joint swelling and arthralgias.  Skin: Negative for pallor and rash.  Neurological: Negative for dizziness, speech difficulty, weakness and headaches.  Hematological: Negative for adenopathy. Does not bruise/bleed easily.  Psychiatric/Behavioral: Negative for confusion and sleep disturbance. The patient is not nervous/anxious.      PE; Blood pressure 133/79, pulse 70, temperature 97.8 F (36.6 C), temperature source Temporal, resp. rate 16, height 5\' 5"  (1.651 m), weight 261 lb (118.389 kg), last menstrual period 05/10/2006, SpO2 98.00%. Gen: Alert, well appearing.  Patient is oriented to person, place, time, and situation. AFFECT: pleasant, lucid thought and speech. ENT: Ears: EACs clear, normal epithelium.  TMs with good light reflex and landmarks bilaterally.  Eyes: no injection, icteris, swelling, or exudate.  EOMI, PERRLA. Nose: no drainage or turbinate edema/swelling.  No injection or focal lesion.  Mouth: lips without lesion/swelling.  Oral mucosa pink and moist.  Dentition intact and without obvious caries or gingival swelling.  Oropharynx without erythema, exudate, or swelling.  Neck: supple/nontender.  No LAD, mass, or TM.  Carotid pulses 2+ bilaterally, without bruits. CV: RRR, no m/r/g.   LUNGS: CTA bilat, nonlabored resps, good aeration in all lung fields. ABD: soft, NT, ND, BS normal.  No hepatospenomegaly or mass.  No bruits. EXT: no clubbing, cyanosis, or edema.  Musculoskeletal: no joint swelling, erythema, warmth, or tenderness.  ROM of all joints intact. Skin - no sores or suspicious lesions or rashes or color changes  Pertinent labs:  None today  ASSESSMENT AND PLAN:   Health maintenance examination Reviewed age and gender appropriate health maintenance issues (prudent diet, regular exercise, health risks of tobacco and excessive alcohol, use of seatbelts, fire alarms in home, use of sunscreen).  Also reviewed age and gender appropriate health  screening as well as vaccine recommendations. Health panel labs drawn today. Updated her health maintenance section: colonoscopy this year normal, mammogram 08/2013 normal at GYN.    FOLLOW UP:  Return in about 1 year (around 06/23/2014) for CPE.

## 2013-06-23 NOTE — Assessment & Plan Note (Signed)
Reviewed age and gender appropriate health maintenance issues (prudent diet, regular exercise, health risks of tobacco and excessive alcohol, use of seatbelts, fire alarms in home, use of sunscreen).  Also reviewed age and gender appropriate health screening as well as vaccine recommendations. Health panel labs drawn today. Updated her health maintenance section: colonoscopy this year normal, mammogram 08/2013 normal at GYN.

## 2013-06-28 ENCOUNTER — Telehealth: Payer: Self-pay | Admitting: *Deleted

## 2013-06-28 MED ORDER — ATORVASTATIN CALCIUM 40 MG PO TABS: 40.0000 mg | ORAL_TABLET | Freq: Every day | ORAL | Status: DC

## 2013-06-28 NOTE — Telephone Encounter (Signed)
Message copied by Marlene Lard on Tue Jun 28, 2013  3:55 PM ------      Message from: Jeoffrey Massed      Created: Mon Jun 27, 2013 11:14 AM       Pls notify pt that all her labs were good except her bad cholesterol was up.  I recommend switching from her current simvastatin (generic zocor) to atorvastatin (generic lipitor).  If she's agreeable then please eRx atorvastatin 40mg , 1 tab po qd, #30, RF x 2.  Recheck fasting cholesterol in 3 mo.--thx ------

## 2013-08-11 ENCOUNTER — Ambulatory Visit (INDEPENDENT_AMBULATORY_CARE_PROVIDER_SITE_OTHER): Payer: 59

## 2013-08-11 DIAGNOSIS — Z23 Encounter for immunization: Secondary | ICD-10-CM

## 2013-08-22 ENCOUNTER — Other Ambulatory Visit: Payer: Self-pay | Admitting: Family Medicine

## 2013-10-31 ENCOUNTER — Encounter: Payer: Self-pay | Admitting: Family Medicine

## 2013-10-31 ENCOUNTER — Ambulatory Visit (INDEPENDENT_AMBULATORY_CARE_PROVIDER_SITE_OTHER): Payer: 59 | Admitting: Family Medicine

## 2013-10-31 VITALS — BP 135/78 | HR 72 | Temp 98.2°F | Ht 65.0 in | Wt 264.5 lb

## 2013-10-31 DIAGNOSIS — J069 Acute upper respiratory infection, unspecified: Secondary | ICD-10-CM

## 2013-10-31 DIAGNOSIS — J209 Acute bronchitis, unspecified: Secondary | ICD-10-CM

## 2013-10-31 MED ORDER — HYDROCODONE-HOMATROPINE 5-1.5 MG/5ML PO SYRP: ORAL_SOLUTION | ORAL | Status: DC

## 2013-10-31 NOTE — Progress Notes (Signed)
OFFICE NOTE  10/31/2013  CC:  Chief Complaint  Patient presents with  . Cough     HPI: Patient is a 52 y.o. Caucasian female who is here for cough.   Onset 3-4 days ago, nasal congestion/facial fullness/pressure. Lots of coughing.  No wheezing, no fever.  No n/v/d. Tired from lack of sleep.  Multisymptom cold formula only knocks her out. She is not a smoker.  She got a flu vaccine this season.  Pertinent PMH:  Past Medical History  Diagnosis Date  . Hemorrhoids 02/24/01    Colonoscopy; Dr. Loreta Ave  . Palpitations     ECHO 09/2010 normal.  Rarely has to take 10mg  propranolol prn palpitations.  Marland Kitchen GERD (gastroesophageal reflux disease)     w/ hiatal hernia.  EGD 4/172002 (Dr. Loreta Ave) normal biopsy of esophagus  . Hyperlipidemia     intolerant of simvastatin  . Vitamin B12 deficiency 2010    IM therapy at one time but switched to oral in early 2012 and levels ok  . Rotator cuff tendonitis 09/02/2012  . Chronic constipation     does well on 2 OTC stool softeners per night   Past surgical, social, and family history reviewed and no changes noted since last office visit.  MEDS:  Outpatient Prescriptions Prior to Visit  Medication Sig Dispense Refill  . atorvastatin (LIPITOR) 40 MG tablet Take 1 tablet (40 mg total) by mouth daily.  90 tablet  0  . Docusate Calcium (STOOL SOFTENER PO) Take by mouth.      . estradiol (ESTRACE) 0.5 MG tablet Take 1 tablet by mouth daily.      . Omega-3 Fatty Acids (FISH OIL) 1200 MG CAPS Take 1 capsule by mouth daily.        Marland Kitchen omeprazole (PRILOSEC) 20 MG capsule Take 20 mg by mouth daily.      . simvastatin (ZOCOR) 20 MG tablet Take 1 tablet (20 mg total) by mouth at bedtime.  90 tablet  1  . simvastatin (ZOCOR) 20 MG tablet Take 1 tablet (20 mg total) by mouth at bedtime.  90 tablet  1  . vitamin B-12 (CYANOCOBALAMIN) 500 MCG tablet Take 500 mcg by mouth daily.        Marland Kitchen RASPBERRY PO Take 1 tablet by mouth 2 (two) times daily.       No  facility-administered medications prior to visit.    PE: Blood pressure 135/78, pulse 72, temperature 98.2 F (36.8 C), temperature source Temporal, height 5\' 5"  (1.651 m), weight 264 lb 8 oz (119.976 kg), last menstrual period 05/10/2006, SpO2 99.00%. VS: noted--normal. Gen: alert, NAD, NONTOXIC APPEARING. HEENT: eyes without injection, drainage, or swelling.  Ears: EACs clear, TMs with normal light reflex and landmarks.  Nose: Clear rhinorrhea, with some dried, crusty exudate adherent to mildly injected mucosa.  No purulent d/c.  No paranasal sinus TTP.  No facial swelling.  Throat and mouth without focal lesion.  No pharyngial swelling, erythema, or exudate.   Neck: supple, no LAD.   LUNGS: CTA bilat, nonlabored resps.  Mild post-exhalation coughing with forced expiratory maneuver. CV: RRR, no m/r/g. EXT: no c/c/e SKIN: no rash    IMPRESSION AND PLAN:  Viral URI with acute bronchitis. Symptomatic care discussed. Robitussin DM daytime, hycodan susp hs use. Saline nasal spray. Signs/symptoms to call or return for were reviewed and pt expressed understanding.  An After Visit Summary was printed and given to the patient.  FOLLOW UP: prn

## 2013-10-31 NOTE — Progress Notes (Signed)
Pre-visit discussion using our clinic review tool. No additional management support is needed unless otherwise documented below in the visit note.  

## 2013-11-05 ENCOUNTER — Other Ambulatory Visit: Payer: Self-pay | Admitting: Family Medicine

## 2014-02-28 ENCOUNTER — Encounter: Payer: Self-pay | Admitting: Nurse Practitioner

## 2014-02-28 ENCOUNTER — Ambulatory Visit (INDEPENDENT_AMBULATORY_CARE_PROVIDER_SITE_OTHER): Payer: 59 | Admitting: Nurse Practitioner

## 2014-02-28 VITALS — BP 104/66 | HR 57 | Temp 97.4°F | Ht 65.0 in | Wt 245.0 lb

## 2014-02-28 DIAGNOSIS — J019 Acute sinusitis, unspecified: Secondary | ICD-10-CM

## 2014-02-28 MED ORDER — SINUS RINSE BOTTLE KIT NA PACK: PACK | NASAL | Status: DC

## 2014-02-28 MED ORDER — AMOXICILLIN-POT CLAVULANATE 875-125 MG PO TABS: 1.0000 | ORAL_TABLET | Freq: Two times a day (BID) | ORAL | Status: DC

## 2014-02-28 NOTE — Progress Notes (Signed)
   Subjective:    Patient ID: Jodi [name], female    DOB: 02/09/1961, 52 y.o.   MRN: [id number]  Cough This is a chronic problem. The current episode started 1 to 4 weeks ago (2 wks). The problem has been gradually worsening. The problem occurs hourly. The cough is productive of sputum. Associated symptoms include headaches, nasal congestion and postnasal drip. Pertinent negatives include no chest pain, chills, ear congestion, ear pain, fever, sore throat, shortness of breath or wheezing. Nothing aggravates the symptoms. Treatments tried: claritin, OTC cold meds. The treatment provided no relief.      Review of Systems  Constitutional: Positive for fatigue. Negative for fever, chills, activity change and appetite change.  HENT: Positive for congestion, postnasal drip and voice change (nasal). Negative for ear pain and sore throat.   Respiratory: Positive for cough. Negative for chest tightness, shortness of breath and wheezing.   Cardiovascular: Negative for chest pain.  Musculoskeletal: Negative for back pain.  Neurological: Positive for headaches.       Objective:   Physical Exam  Constitutional: She is oriented to person, place, and time. She appears well-developed and well-nourished.  HENT:  Head: Normocephalic and atraumatic.  Right Ear: External ear normal.  Left Ear: External ear normal.  Mouth/Throat: Oropharynx is clear and moist. No oropharyngeal exudate.  Eyes: Conjunctivae are normal. Right eye exhibits no discharge. Left eye exhibits no discharge.  Neck: Normal range of motion. Neck supple. No thyromegaly present.  Cardiovascular: Normal rate, regular rhythm and normal heart sounds.   No murmur heard. Pulmonary/Chest: Effort normal and breath sounds normal. No respiratory distress. She has no wheezes. She has no rales.  Lymphadenopathy:    She has no cervical adenopathy.  Neurological: She is alert and oriented to person, place, and time.  Skin: Skin is warm and  dry.  Psychiatric: She has a normal mood and affect. Her behavior is normal. Thought content normal.          Assessment & Plan:  1. Acute sinusitis 2 wk duration. - amoxicillin-clavulanate (AUGMENTIN) 875-125 MG per tablet; Take 1 tablet by mouth 2 (two) times daily.  Dispense: 10 tablet; Refill: 0 - Hypertonic Nasal Wash (SINUS RINSE BOTTLE KIT) PACK; Neil med sinus rinse. Rinse sinuses once daily.  Dispense: 1 each; Refill: 1 F/u PRN  

## 2014-02-28 NOTE — Progress Notes (Signed)
Pre visit review using our clinic review tool, if applicable. No additional management support is needed unless otherwise documented below in the visit note. 

## 2014-02-28 NOTE — Patient Instructions (Signed)
Start antibiotic. Eat yogurt daily at lunch or afternoon to help prevent diarrhea that can be caused by antibiotic. Start daily sinus rinses (Neilmed Sinus rinse) for at least 5-7 days. You may use pseudoephedrine 30 mg twice daily for 4-6 days. Please call for re-evaluation if you are not improving.   Sinusitis Sinusitis is redness, soreness, and swelling (inflammation) of the paranasal sinuses. Paranasal sinuses are air pockets within the bones of your face (beneath the eyes, the middle of the forehead, or above the eyes). In healthy paranasal sinuses, mucus is able to drain out, and air is able to circulate through them by way of your nose. However, when your paranasal sinuses are inflamed, mucus and air can become trapped. This can allow bacteria and other germs to grow and cause infection. Sinusitis can develop quickly and last only a short time (acute) or continue over a long period (chronic). Sinusitis that lasts for more than 12 weeks is considered chronic.  CAUSES  Causes of sinusitis include:  Allergies.  Structural abnormalities, such as displacement of the cartilage that separates your nostrils (deviated septum), which can decrease the air flow through your nose and sinuses and affect sinus drainage.  Functional abnormalities, such as when the small hairs (cilia) that line your sinuses and help remove mucus do not work properly or are not present. SYMPTOMS  Symptoms of acute and chronic sinusitis are the same. The primary symptoms are pain and pressure around the affected sinuses. Other symptoms include:  Upper toothache.  Earache.  Headache.  Bad breath.  Decreased sense of smell and taste.  A cough, which worsens when you are lying flat.  Fatigue.  Fever.  Thick drainage from your nose, which often is green and may contain pus (purulent).  Swelling and warmth over the affected sinuses. DIAGNOSIS  Your caregiver will perform a physical exam. During the exam, your  caregiver may:  Look in your nose for signs of abnormal growths in your nostrils (nasal polyps).  Tap over the affected sinus to check for signs of infection.  View the inside of your sinuses (endoscopy) with a special imaging device with a light attached (endoscope), which is inserted into your sinuses. If your caregiver suspects that you have chronic sinusitis, one or more of the following tests may be recommended:  Allergy tests.  Nasal culture A sample of mucus is taken from your nose and sent to a lab and screened for bacteria.  Nasal cytology A sample of mucus is taken from your nose and examined by your caregiver to determine if your sinusitis is related to an allergy. TREATMENT  Most cases of acute sinusitis are related to a viral infection and will resolve on their own within 10 days. Sometimes medicines are prescribed to help relieve symptoms (pain medicine, decongestants, nasal steroid sprays, or saline sprays).  However, for sinusitis related to a bacterial infection, your caregiver will prescribe antibiotic medicines. These are medicines that will help kill the bacteria causing the infection.  Rarely, sinusitis is caused by a fungal infection. In theses cases, your caregiver will prescribe antifungal medicine. For some cases of chronic sinusitis, surgery is needed. Generally, these are cases in which sinusitis recurs more than 3 times per year, despite other treatments. HOME CARE INSTRUCTIONS   Drink plenty of water. Water helps thin the mucus so your sinuses can drain more easily.  Use a humidifier.  Inhale steam 3 to 4 times a day (for example, sit in the bathroom with the shower running).    Apply a warm, moist washcloth to your face 3 to 4 times a day, or as directed by your caregiver.  Use saline nasal sprays to help moisten and clean your sinuses.  Take over-the-counter or prescription medicines for pain, discomfort, or fever only as directed by your caregiver. SEEK  IMMEDIATE MEDICAL CARE IF:  You have increasing pain or severe headaches.  You have nausea, vomiting, or drowsiness.  You have swelling around your face.  You have vision problems.  You have a stiff neck.  You have difficulty breathing. MAKE SURE YOU:   Understand these instructions.  Will watch your condition.  Will get help right away if you are not doing well or get worse. Document Released: 10/27/2005 Document Revised: 01/19/2012 Document Reviewed: 11/11/2011 Exit[employer] Patient Information 2014 [employer]. 

## 2014-03-01 ENCOUNTER — Other Ambulatory Visit: Payer: Self-pay | Admitting: Family Medicine

## 2014-03-01 DIAGNOSIS — J019 Acute sinusitis, unspecified: Secondary | ICD-10-CM

## 2014-03-01 MED ORDER — AMOXICILLIN-POT CLAVULANATE 875-125 MG PO TABS: 1.0000 | ORAL_TABLET | Freq: Two times a day (BID) | ORAL | Status: DC

## 2014-03-01 MED ORDER — SINUS RINSE BOTTLE KIT NA PACK: PACK | NASAL | Status: DC

## 2014-03-01 NOTE — Telephone Encounter (Signed)
[facility] antibiotics [facility].   [facility] to [facility] per patient.  Pt states they would call and cancel [facility] prescriptions.

## 2014-06-27 ENCOUNTER — Other Ambulatory Visit: Payer: Self-pay | Admitting: Family Medicine

## 2014-06-27 MED ORDER — ATORVASTATIN CALCIUM 40 MG PO TABS: ORAL_TABLET | ORAL | Status: DC

## 2014-09-05 ENCOUNTER — Other Ambulatory Visit: Payer: Self-pay | Admitting: Obstetrics and Gynecology

## 2014-09-06 LAB — CYTOLOGY - PAP

## 2014-09-11 ENCOUNTER — Encounter: Payer: Self-pay | Admitting: Nurse Practitioner

## 2014-10-09 ENCOUNTER — Other Ambulatory Visit: Payer: Self-pay | Admitting: Family Medicine

## 2014-10-09 NOTE — Telephone Encounter (Signed)
Pt requesting 90 day supply of atorvastatin.  Patient hasn't had CPE since 06/2013.  Patient needs CPE before any further refills.  30 day supply sent into pharmacy.

## 2014-11-28 ENCOUNTER — Other Ambulatory Visit: Payer: Self-pay | Admitting: Family Medicine

## 2014-11-28 MED ORDER — ATORVASTATIN CALCIUM 40 MG PO TABS: ORAL_TABLET | ORAL | Status: DC

## 2014-12-07 ENCOUNTER — Other Ambulatory Visit: Payer: Self-pay | Admitting: Family Medicine

## 2014-12-12 ENCOUNTER — Other Ambulatory Visit: Payer: Self-pay | Admitting: Family Medicine

## 2014-12-12 MED ORDER — ATORVASTATIN CALCIUM 40 MG PO TABS: ORAL_TABLET | ORAL | Status: DC

## 2015-01-04 ENCOUNTER — Other Ambulatory Visit: Payer: Self-pay | Admitting: Family Medicine

## 2015-01-04 MED ORDER — ATORVASTATIN CALCIUM 40 MG PO TABS: ORAL_TABLET | ORAL | Status: DC

## 2015-01-04 NOTE — Telephone Encounter (Signed)
Medication request for atorvastatin 90 day supply was denied.  Pt has not been seen by Dr. Milinda CaveMcGowen since 2014.  She needs a CPE before any further refills.

## 2015-01-09 ENCOUNTER — Encounter: Payer: Self-pay | Admitting: Family Medicine

## 2015-01-09 ENCOUNTER — Ambulatory Visit (INDEPENDENT_AMBULATORY_CARE_PROVIDER_SITE_OTHER): Payer: 59 | Admitting: Family Medicine

## 2015-01-09 VITALS — BP 108/74 | HR 61 | Temp 97.6°F | Resp 18 | Ht 65.0 in | Wt 233.0 lb

## 2015-01-09 DIAGNOSIS — Z0000 Encounter for general adult medical examination without abnormal findings: Secondary | ICD-10-CM

## 2015-01-09 LAB — CBC WITH DIFFERENTIAL/PLATELET
BASOS ABS: 0 10*3/uL (ref 0.0–0.1)
BASOS PCT: 0.4 % (ref 0.0–3.0)
EOS ABS: 0 10*3/uL (ref 0.0–0.7)
Eosinophils Relative: 0.7 % (ref 0.0–5.0)
HCT: 38.2 % (ref 36.0–46.0)
HEMOGLOBIN: 12.9 g/dL (ref 12.0–15.0)
LYMPHS ABS: 1.9 10*3/uL (ref 0.7–4.0)
LYMPHS PCT: 33.3 % (ref 12.0–46.0)
MCHC: 33.7 g/dL (ref 30.0–36.0)
MCV: 95.2 fl (ref 78.0–100.0)
MONO ABS: 0.4 10*3/uL (ref 0.1–1.0)
Monocytes Relative: 7 % (ref 3.0–12.0)
NEUTROS ABS: 3.4 10*3/uL (ref 1.4–7.7)
Neutrophils Relative %: 58.6 % (ref 43.0–77.0)
Platelets: 217 10*3/uL (ref 150.0–400.0)
RBC: 4.01 Mil/uL (ref 3.87–5.11)
RDW: 13.3 % (ref 11.5–15.5)
WBC: 5.8 10*3/uL (ref 4.0–10.5)

## 2015-01-09 LAB — COMPREHENSIVE METABOLIC PANEL
ALT: 23 U/L (ref 0–35)
AST: 17 U/L (ref 0–37)
Albumin: 4.2 g/dL (ref 3.5–5.2)
Alkaline Phosphatase: 74 U/L (ref 39–117)
BUN: 12 mg/dL (ref 6–23)
CALCIUM: 9.2 mg/dL (ref 8.4–10.5)
CHLORIDE: 103 meq/L (ref 96–112)
CO2: 33 meq/L — AB (ref 19–32)
CREATININE: 0.76 mg/dL (ref 0.40–1.20)
GFR: 84.8 mL/min (ref >60.00)
GLUCOSE: 87 mg/dL (ref 70–99)
Potassium: 4.1 mEq/L (ref 3.5–5.1)
Sodium: 140 mEq/L (ref 135–145)
Total Bilirubin: 0.6 mg/dL (ref 0.2–1.2)
Total Protein: 6.8 g/dL (ref 6.0–8.3)

## 2015-01-09 LAB — LIPID PANEL
Cholesterol: 161 mg/dL (ref 0–200)
HDL: 58.4 mg/dL (ref >39.00)
LDL CALC: 81 mg/dL (ref 0–99)
NONHDL: 102.6
TRIGLYCERIDES: 109 mg/dL (ref 0.0–149.0)
Total CHOL/HDL Ratio: 3
VLDL: 21.8 mg/dL (ref 0.0–40.0)

## 2015-01-09 LAB — TSH: TSH: 2.48 u[IU]/mL (ref 0.35–4.50)

## 2015-01-09 NOTE — Assessment & Plan Note (Signed)
Reviewed age and gender appropriate health maintenance issues (prudent diet, regular exercise, health risks of tobacco and excessive alcohol, use of seatbelts, fire alarms in home, use of sunscreen).  Also reviewed age and gender appropriate health screening as well as vaccine recommendations. Vaccines UTD. Colon cancer screening UTD. Pap/pelvic/mammo all UTD through her GYN, Dr. Henderson CloudHorvath. HP labs drawn today.  GREAT JOB ON WT LOSS.

## 2015-01-09 NOTE — Progress Notes (Signed)
Office Note 01/09/2015  CC:  Chief Complaint  Patient presents with  . Annual Exam    fasting    HPI:  Jodi [name] is a 54 y.o. White female who is here for annual health maintenance exam. I have not seen her since last CPE 06/2013, at which time her cholesterol was still up and she had recently had to d/c simvastin due to myalgias.  I started her on atorvastatin $RemoveBeforeD'40mg'glaNSSELjREMKn$  qd and recommended f/u lipids in 2-3 mo but she never made it back for this testing.  Has hx of PVCs: says she wakes up every morning with heart fluttering w/out any dizziness, CP, or SOB or diaphoresis or nausea.  She saw Dr. Acie Fredrickson and had w/u and was given metoprolol to take prn and pt says she has taken approx 5 in a year's time initially, but in last 2 yrs she has taken NONE.  Pt relates a story of learning that her 1st cousin had recently been diagnosed with a nonischemic cardiomyopathy and may have to have a heart transplant.  She said she was told it is genetic. Pt says she is going to set up f/u with Dr. Acie Fredrickson in the near future.  Exercise: 3 nights a week, does CV workouts.  Eyes: does annual exam.  No abnormalities except visual acuity deficit.  Dental: at least q50mo monitoring.  GYN: Dr. Philis Pique, 09/08/14--mammo and pap normal.     Past Medical History  Diagnosis Date  . Hemorrhoids 02/24/01    Colonoscopy; Dr. Collene Mares  . Palpitations     ECHO 09/2010 normal.  Rarely has to take $Remov'10mg'nHVLGi$  propranolol prn palpitations.  Marland Kitchen GERD (gastroesophageal reflux disease)     w/ hiatal hernia.  EGD 4/172002 (Dr. Collene Mares) normal biopsy of esophagus  . Hyperlipidemia     intolerant of simvastatin  . Vitamin B12 deficiency 2010    IM therapy at one time but switched to oral in early 2012 and levels ok  . Rotator cuff tendonitis 09/02/2012  . Chronic constipation     does well on 2 OTC stool softeners per night    Past Surgical History  Procedure Laterality Date  . Cholecystectomy  04/05/02    lap chole  .  Tonsillectomy    . Abdominal hysterectomy  05/26/06    (pathology benign)  . Salpingoophorectomy  05/26/06    Right w/hysterectomy (pathology benign)  . Salpingoophorectomy  12/09/04    Left (pathology benign)  . Colonoscopy  2002; 2014    Int/ext hemorr, o/w normal.  Normal repeat colonoscopy 03/16/13 by Dr. Collene Mares.  Recall 2024.    Family History  Problem Relation Age of Onset  . Diabetes Mother   . Heart disease Mother     CABG  . Hyperlipidemia Mother   . Hypertension Mother   . Cancer Father     lung  . Alcohol abuse Father   . Heart disease Maternal Grandmother   . Hyperlipidemia Maternal Grandmother   . Hypertension Maternal Grandmother   . Stroke Maternal Grandmother   . Diabetes Maternal Grandmother     History   Social History  . Marital Status: Married    Spouse Name: N/A  . Number of Children: N/A  . Years of Education: N/A   Occupational History  . Not on file.   Social History Main Topics  . Smoking status: Never Smoker   . Smokeless tobacco: Never Used  . Alcohol Use: No  . Drug Use: Not on file  . Sexual  Activity: Yes    Birth Control/ Protection: Surgical   Other Topics Concern  . Not on file   Social History Narrative   Married, 2 children, 3 grandchildren.   Works as a Marine scientist for [employer], also part time in [employer] and [employer] [facility].   No T/A/Ds.  Walks 1-3 miles daily.    Outpatient Prescriptions Prior to Visit  Medication Sig Dispense Refill  . atorvastatin (LIPITOR) 40 MG tablet Take 1 tablet by mouth  daily 90 tablet 0  . cyanocobalamin 500 MCG tablet Take 500 mcg by mouth daily.    Mariane Baumgarten Calcium (STOOL SOFTENER PO) Take by mouth.    . estradiol (ESTRACE) 0.5 MG tablet Take 1 tablet by mouth daily.    . Omega-3 Fatty Acids (FISH OIL) 1200 MG CAPS Take 1 capsule by mouth daily.      Marland Kitchen omeprazole (PRILOSEC) 20 MG capsule Take 20 mg by mouth daily.    Marland Kitchen amoxicillin-clavulanate (AUGMENTIN) 875-125 MG per tablet Take 1 tablet by mouth 2  (two) times daily. 10 tablet 0  . HYDROcodone-homatropine (HYCODAN) 5-1.5 MG/5ML syrup 1-2 tsp po qhs prn cough 120 mL 0  . Hypertonic Nasal Wash (SINUS RINSE BOTTLE KIT) PACK Neil med sinus rinse. Rinse sinuses once daily. 1 each 1   No facility-administered medications prior to visit.    Allergies  Allergen Reactions  . Codeine Other (See Comments)    Skin crawls    ROS Review of Systems  Constitutional: Negative for fever, chills, appetite change and fatigue.  HENT: Negative for congestion, dental problem, ear pain and sore throat.   Eyes: Negative for discharge, redness and visual disturbance.  Respiratory: Negative for cough, chest tightness, shortness of breath and wheezing.   Cardiovascular: Positive for palpitations (see HPI). Negative for chest pain and leg swelling.  Gastrointestinal: Negative for nausea, vomiting, abdominal pain, diarrhea and blood in stool.  Genitourinary: Negative for dysuria, urgency, frequency, hematuria, flank pain and difficulty urinating.  Musculoskeletal: Negative for myalgias, back pain, joint swelling, arthralgias and neck stiffness.  Skin: Negative for pallor and rash.  Neurological: Negative for dizziness, speech difficulty, weakness and headaches.  Hematological: Negative for adenopathy. Does not bruise/bleed easily.  Psychiatric/Behavioral: Negative for confusion and sleep disturbance. The patient is not nervous/anxious.     PE; Blood pressure 108/74, pulse 61, temperature 97.6 F (36.4 C), temperature source Temporal, resp. rate 18, height _0  (1.651 m), weight 233 lb (105.688 kg), last menstrual period 05/10/2006, SpO2 98 %. BMI 38.8 Gen: Alert, well appearing.  Patient is oriented to person, place, time, and situation. AFFECT: pleasant, lucid thought and speech. ENT: Ears: EACs clear, normal epithelium.  TMs with good light reflex and landmarks bilaterally.  Eyes: no injection, icteris, swelling, or exudate.  EOMI, PERRLA. Nose: no  drainage or turbinate edema/swelling.  No injection or focal lesion.  Mouth: lips without lesion/swelling.  Oral mucosa pink and moist.  Dentition intact and without obvious caries or gingival swelling.  Oropharynx without erythema, exudate, or swelling.  Neck: supple/nontender.  No LAD, mass, or TM.  Carotid pulses 2+ bilaterally, without bruits. CV: RRR, no m/r/g.   LUNGS: CTA bilat, nonlabored resps, good aeration in all lung fields. ABD: soft, NT, ND, BS normal.  No hepatospenomegaly or mass.  No bruits. EXT: no clubbing, cyanosis, or edema.  Musculoskeletal: no joint swelling, erythema, warmth, or tenderness.  ROM of all joints intact. Skin - no sores or suspicious lesions or rashes or color changes  Pertinent labs:  Lab Results  Component Value Date   TSH 3.39 06/23/2013   Lab Results  Component Value Date   WBC 6.1 06/23/2013   HGB 12.4 06/23/2013   HCT 36.7 06/23/2013   MCV 95.9 06/23/2013   PLT 224.0 06/23/2013   Lab Results  Component Value Date   CREATININE 0.8 06/23/2013   BUN 15 06/23/2013   NA 140 06/23/2013   K 4.5 06/23/2013   CL 100 06/23/2013   CO2 29 06/23/2013   Lab Results  Component Value Date   ALT 23 06/23/2013   AST 18 06/23/2013   ALKPHOS 71 06/23/2013   BILITOT 0.6 06/23/2013   Lab Results  Component Value Date   CHOL 229* 06/23/2013   Lab Results  Component Value Date   HDL 59.50 06/23/2013   No results found for: [facility] Lab Results  Component Value Date   TRIG 148.0 06/23/2013   Lab Results  Component Value Date   CHOLHDL 4 06/23/2013    ASSESSMENT AND PLAN:   Health maintenance examination Reviewed age and gender appropriate health maintenance issues (prudent diet, regular exercise, health risks of tobacco and excessive alcohol, use of seatbelts, fire alarms in home, use of sunscreen).  Also reviewed age and gender appropriate health screening as well as vaccine recommendations. Vaccines UTD. Colon cancer screening  UTD. Pap/pelvic/mammo all UTD through her GYN, Dr. Philis Pique. HP labs drawn today.  GREAT JOB ON WT LOSS.    An After Visit Summary was printed and given to the patient.  FOLLOW UP:  Return in about 1 year (around 01/09/2016) for annual CPE (fasting).

## 2015-01-09 NOTE — Progress Notes (Signed)
Pre visit review using our clinic review tool, if applicable. No additional management support is needed unless otherwise documented below in the visit note. 

## 2015-04-06 ENCOUNTER — Emergency Department (HOSPITAL_COMMUNITY): Payer: 59

## 2015-04-06 ENCOUNTER — Emergency Department (HOSPITAL_COMMUNITY)
Admission: EM | Admit: 2015-04-06 | Discharge: 2015-04-06 | Disposition: A | Discharge status: Home / Self Care | Payer: 59 | Attending: Emergency Medicine | Admitting: Emergency Medicine

## 2015-04-06 ENCOUNTER — Encounter (HOSPITAL_COMMUNITY): Payer: Self-pay | Admitting: *Deleted

## 2015-04-06 DIAGNOSIS — Y998 Other external cause status: Secondary | ICD-10-CM | POA: Insufficient documentation

## 2015-04-06 DIAGNOSIS — Y9289 Other specified places as the place of occurrence of the external cause: Secondary | ICD-10-CM | POA: Insufficient documentation

## 2015-04-06 DIAGNOSIS — R011 Cardiac murmur, unspecified: Secondary | ICD-10-CM | POA: Diagnosis not present

## 2015-04-06 DIAGNOSIS — Z79899 Other long term (current) drug therapy: Secondary | ICD-10-CM | POA: Insufficient documentation

## 2015-04-06 DIAGNOSIS — K219 Gastro-esophageal reflux disease without esophagitis: Secondary | ICD-10-CM | POA: Diagnosis not present

## 2015-04-06 DIAGNOSIS — Z8719 Personal history of other diseases of the digestive system: Secondary | ICD-10-CM | POA: Insufficient documentation

## 2015-04-06 DIAGNOSIS — S9031XA Contusion of right foot, initial encounter: Secondary | ICD-10-CM | POA: Insufficient documentation

## 2015-04-06 DIAGNOSIS — W228XXA Striking against or struck by other objects, initial encounter: Secondary | ICD-10-CM | POA: Insufficient documentation

## 2015-04-06 DIAGNOSIS — E538 Deficiency of other specified B group vitamins: Secondary | ICD-10-CM | POA: Insufficient documentation

## 2015-04-06 DIAGNOSIS — S99921A Unspecified injury of right foot, initial encounter: Secondary | ICD-10-CM | POA: Diagnosis present

## 2015-04-06 DIAGNOSIS — Y9389 Activity, other specified: Secondary | ICD-10-CM | POA: Insufficient documentation

## 2015-04-06 DIAGNOSIS — E785 Hyperlipidemia, unspecified: Secondary | ICD-10-CM | POA: Diagnosis not present

## 2015-04-06 NOTE — ED Provider Notes (Signed)
CSN: [id number]     Arrival date & time 04/06/15  1044 History   First MD Initiated Contact with Patient 04/06/15 1252     Chief Complaint  Patient presents with  . Foot Injury    right     (Consider location/radiation/quality/duration/timing/severity/associated sxs/prior Treatment) Patient is a 54 y.o. female presenting with foot injury. The history is provided by the patient.  Foot Injury Location:  Foot Time since incident:  1 day Injury: yes   Mechanism of injury comment:  Stumped right foot yesterday, injured the fifth toe. Foot location:  R foot Pain details:    Quality:  Throbbing   Radiates to:  R leg   Severity:  Moderate   Onset quality:  Sudden   Duration:  1 day   Timing:  Intermittent   Progression:  Worsening Chronicity:  New Dislocation: no   Relieved by:  Nothing Ineffective treatments:  Ice and elevation Associated symptoms: decreased ROM   Associated symptoms: no numbness and no tingling   Risk factors: no frequent fractures and no known bone disorder     Past Medical History  Diagnosis Date  . Hemorrhoids 02/24/01    Colonoscopy; Dr. Loreta Ave  . Palpitations     ECHO 09/2010 normal.  Rarely has to take  propranolol prn palpitations.  Marland Kitchen GERD (gastroesophageal reflux disease)     w/ hiatal hernia.  EGD 4/172002 (Dr. Loreta Ave) normal biopsy of esophagus  . Hyperlipidemia     intolerant of simvastatin  . Vitamin B12 deficiency 2010    IM therapy at one time but switched to oral in early 2012 and levels ok  . Rotator cuff tendonitis 09/02/2012  . Chronic constipation     does well on 2 OTC stool softeners per night   Past Surgical History  Procedure Laterality Date  . Cholecystectomy  04/05/02    lap chole  . Tonsillectomy    . Abdominal hysterectomy  05/26/06    (pathology benign)  . Salpingoophorectomy  05/26/06    Right w/hysterectomy (pathology benign)  . Salpingoophorectomy  12/09/04    Left (pathology benign)  . Colonoscopy  2002; 2014   Int/ext hemorr, o/w normal.  Normal repeat colonoscopy 03/16/13 by Dr. Loreta Ave.  Recall 2024.   Family History  Problem Relation Age of Onset  . Diabetes Mother   . Heart disease Mother     CABG  . Hyperlipidemia Mother   . Hypertension Mother   . Cancer Father     lung  . Alcohol abuse Father   . Heart disease Maternal Grandmother   . Hyperlipidemia Maternal Grandmother   . Hypertension Maternal Grandmother   . Stroke Maternal Grandmother   . Diabetes Maternal Grandmother    History  Substance Use Topics  . Smoking status: Never Smoker   . Smokeless tobacco: Never Used  . Alcohol Use: No   OB History    Gravida Para Term Preterm AB TAB SAB Ectopic Multiple Living   3 2 0            Review of Systems  Cardiovascular: Positive for palpitations.  Musculoskeletal: Positive for arthralgias.  All other systems reviewed and are negative.     Allergies  Codeine  Home Medications   Prior to Admission medications   Medication Sig Start Date End Date Taking? Authorizing Provider  atorvastatin (LIPITOR) 40 MG tablet Take 1 tablet by mouth  daily 01/04/15  Yes Jeoffrey Massed, MD  cyanocobalamin 500 MCG tablet Take 500 mcg by  mouth daily.   Yes Historical Provider, MD  Docusate Calcium (STOOL SOFTENER PO) Take 2 tablets by mouth at bedtime.    Yes Historical Provider, MD  estradiol (ESTRACE) 1 MG tablet Take 1 tablet by mouth daily. 01/15/15  Yes Historical Provider, MD  Omega-3 Fatty Acids (FISH OIL) 1200 MG CAPS Take 2 capsules by mouth daily.    Yes Historical Provider, MD  omeprazole (PRILOSEC) 40 MG capsule Take 1 capsule by mouth daily. 01/15/15  Yes Historical Provider, MD   BP 116/66 mmHg  Pulse 65  Temp(Src) 98.2 F (36.8 C) (Oral)  Resp 20  Ht 5' 5.5" (1.664 m)  Wt 230 lb (104.327 kg)  BMI 37.68 kg/m2  SpO2 100%  LMP 05/10/2006 Physical Exam  Constitutional: She is oriented to person, place, and time. She appears well-developed and well-nourished.  Non-toxic  appearance.  HENT:  Head: Normocephalic.  Right Ear: Tympanic membrane and external ear normal.  Left Ear: Tympanic membrane and external ear normal.  Eyes: EOM and lids are normal. Pupils are equal, round, and reactive to light.  Neck: Normal range of motion. Neck supple. Carotid bruit is not present.  Cardiovascular: Normal rate, regular rhythm, intact distal pulses and normal pulses.   Murmur heard.  Systolic murmur is present with a grade of 2/6  Pulmonary/Chest: Breath sounds normal. No respiratory distress.  Abdominal: Soft. Bowel sounds are normal. There is no tenderness. There is no guarding.  Musculoskeletal: Normal range of motion.       Right foot: There is tenderness. There is normal capillary refill and no deformity.       Feet:  Lymphadenopathy:       Head (right side): No submandibular adenopathy present.       Head (left side): No submandibular adenopathy present.    She has no cervical adenopathy.  Neurological: She is alert and oriented to person, place, and time. She has normal strength. No cranial nerve deficit or sensory deficit.  Skin: Skin is warm and dry.  Psychiatric: She has a normal mood and affect. Her speech is normal.  Nursing note and vitals reviewed.   ED Course  Procedures (including critical care time) Labs Review Labs Reviewed - No data to display  Imaging Review Dg Foot Complete Right  04/06/2015   CLINICAL DATA:  Lateral foot pain following injury yesterday.  EXAM: RIGHT FOOT COMPLETE - 3+ VIEW  COMPARISON:  None.  FINDINGS: The mineralization and alignment are normal. There is no evidence of acute fracture or dislocation. The joint spaces are maintained aside from minimal narrowing at the first metatarsal phalangeal joint. Small accessory ossicles noted. There is no apparent focal soft tissue swelling.  IMPRESSION: No acute osseous findings.   Electronically Signed   By: Carey Bullocks M.D.   On: 04/06/2015 11:35     EKG  Interpretation None      MDM  Vital signs are well within normal limits. X-ray of the right foot is negative for fracture or dislocation. There no neurovascular changes appreciated. Suspect the patient has a contusion to the right foot. Patient is fitted with a postoperative shoe. She will apply ice and elevate the right foot. She will also use Tylenol and ibuprofen for soreness. She is invited to return to the emergency department for any changes, problems, or concerns.    Final diagnoses:  None    **I have reviewed nursing notes, vital signs, and all appropriate lab and imaging results for this patient.Link Snuffer  Danae OrleansBryant, PA-C 04/06/15 1331  Raeford RazorStephen Kohut, MD 04/07/15 [phone]

## 2015-04-06 NOTE — ED Notes (Signed)
Pt states she kicked a box of copy paper yesterday with rt foot, co pain to outer edge of foot behind fifth toe, pain radiates to ankle. Pt unable to see here PCP or orthopedist.

## 2015-04-06 NOTE — Discharge Instructions (Signed)
Your vital signs are within normal limits. Your Xray of the right foot is negative for acute fracture. Please apply ice and use the post operative shoe.  Use tylenol and ibuprofen for pain. Please return if any changes or problem. Foot Contusion  A foot contusion is a deep bruise to the foot. Contusions happen when an injury causes bleeding under the skin. Signs of bruising include pain, puffiness (swelling), and discolored skin. The contusion may turn blue, purple, or yellow. HOME CARE  Put ice on the injured area.  Put ice in a plastic bag.  Place a towel between your skin and the bag.  Leave the ice on for 15-20 minutes, 03-04 times a day.  Only take medicines as told by your doctor.  Use an elastic wrap only as told. You may remove the wrap for sleeping, showering, and bathing. Take the wrap off if you lose feeling (numb) in your toes, or they turn blue or cold. Put the wrap on more loosely.  Keep the foot raised (elevated) with pillows.  If your foot hurts, avoid standing or walking.  When your doctor says it is okay to use your foot, start using it slowly. If you have pain, lessen how much you use your foot.  See your doctor as told. GET HELP RIGHT AWAY IF:   You have more redness, puffiness, or pain in your foot.  Your puffiness or pain does not get better with medicine.  You lose feeling in your foot, or you cannot move your toes.  Your foot turns cold or blue.  You have pain when you move your toes.  Your foot feels warm.  Your contusion does not get better in 2 days. MAKE SURE YOU:   Understand these instructions.  Will watch this condition.  Will get help right away if you or your child is not doing well or gets worse. Document Released: 08/05/2008 Document Revised: 04/27/2012 Document Reviewed: 09/30/2011 [facility] Patient Information 2015 [employer] [place]. This information is not intended to replace advice given to you by your health care provider. Make sure  you discuss any questions you have with your health care provider.

## 2015-05-15 ENCOUNTER — Other Ambulatory Visit: Payer: Self-pay | Admitting: Family Medicine

## 2015-05-15 NOTE — Telephone Encounter (Signed)
RF request for atorvastatin.  LOV: 01/09/15 Next ov: None Last written: 01/04/15 #90 w/ 0RF

## 2015-11-11 ENCOUNTER — Other Ambulatory Visit: Payer: Self-pay | Admitting: Family Medicine

## 2015-11-11 DIAGNOSIS — M722 Plantar fascial fibromatosis: Secondary | ICD-10-CM

## 2015-11-11 HISTORY — DX: Plantar fascial fibromatosis: M72.2

## 2015-11-13 NOTE — Telephone Encounter (Signed)
RF request for atorvastatin LOV: 01/09/15 Next ov: None Last written: 05/15/15 #90 w/ 1RF

## 2016-04-09 ENCOUNTER — Other Ambulatory Visit: Payer: Self-pay | Admitting: Family Medicine

## 2016-04-09 NOTE — Telephone Encounter (Signed)
Pt advised and voiced understanding.  Apt made for 05/20/16 at 9:00am.

## 2016-04-09 NOTE — Telephone Encounter (Signed)
RF request for atorvastatin LOV: 01/09/15 Next ov: None Last written: 11/11/15 #90 w/ 1RF  Rx sent for #90 w/ 0Rf. Pt is over due for CPE, needs to be seen for more refills.

## 2016-05-20 ENCOUNTER — Encounter: Payer: Self-pay | Admitting: Family Medicine

## 2016-05-22 ENCOUNTER — Ambulatory Visit (INDEPENDENT_AMBULATORY_CARE_PROVIDER_SITE_OTHER): Payer: 59 | Admitting: Family Medicine

## 2016-05-22 ENCOUNTER — Encounter: Payer: Self-pay | Admitting: Family Medicine

## 2016-05-22 VITALS — BP 100/74 | HR 69 | Temp 98.0°F | Resp 16 | Ht 65.25 in | Wt 271.5 lb

## 2016-05-22 DIAGNOSIS — Z0000 Encounter for general adult medical examination without abnormal findings: Secondary | ICD-10-CM

## 2016-05-22 DIAGNOSIS — E538 Deficiency of other specified B group vitamins: Secondary | ICD-10-CM | POA: Diagnosis not present

## 2016-05-22 LAB — VITAMIN B12: VITAMIN B 12: 826 pg/mL (ref 211–911)

## 2016-05-22 LAB — CBC WITH DIFFERENTIAL/PLATELET
BASOS ABS: 0 10*3/uL (ref 0.0–0.1)
Basophils Relative: 0.4 % (ref 0.0–3.0)
EOS ABS: 0.1 10*3/uL (ref 0.0–0.7)
Eosinophils Relative: 1 % (ref 0.0–5.0)
HEMATOCRIT: 38.4 % (ref 36.0–46.0)
HEMOGLOBIN: 12.8 g/dL (ref 12.0–15.0)
LYMPHS PCT: 33.8 % (ref 12.0–46.0)
Lymphs Abs: 2.5 10*3/uL (ref 0.7–4.0)
MCHC: 33.3 g/dL (ref 30.0–36.0)
MCV: 96.3 fl (ref 78.0–100.0)
Monocytes Absolute: 0.4 10*3/uL (ref 0.1–1.0)
Monocytes Relative: 5.7 % (ref 3.0–12.0)
Neutro Abs: 4.4 10*3/uL (ref 1.4–7.7)
Neutrophils Relative %: 59.1 % (ref 43.0–77.0)
Platelets: 223 10*3/uL (ref 150.0–400.0)
RBC: 3.98 Mil/uL (ref 3.87–5.11)
RDW: 13.6 % (ref 11.5–15.5)
WBC: 7.4 10*3/uL (ref 4.0–10.5)

## 2016-05-22 LAB — TSH: TSH: 4.59 u[IU]/mL — ABNORMAL HIGH (ref 0.35–4.50)

## 2016-05-22 NOTE — Progress Notes (Signed)
Pre visit review using our clinic review tool, if applicable. No additional management support is needed unless otherwise documented below in the visit note. 

## 2016-05-22 NOTE — Progress Notes (Signed)
Office Note 05/22/2016  CC:  Chief Complaint  Patient presents with  . Annual Exam    Pt is fasting.     HPI:  Jodi [name] is a 55 y.o. White female who is here for annual health maintenance exam. GYN MD is Dr. Henderson Cloud. Last mammo: 10/2015-normal. Past pap: 10/2015-normal.  Exercise: had plantar fasciitis that she had to neglect due to her mother being ill.  Her mother recently passed away. Jodi [name] just got a steroid injection in foot 3 wks ago and hopefully this will allow her to start exercising again.  No acute complaints.  Past Medical History  Diagnosis Date  . Hemorrhoids 02/24/01    Colonoscopy; Dr. Loreta Ave  . Palpitations     ECHO 09/2010 normal.  Rarely has to take 10mg  propranolol prn palpitations.  Jodi [name] GERD (gastroesophageal reflux disease)     w/ hiatal hernia.  EGD 4/172002 (Dr. Loreta Ave) normal biopsy of esophagus  . Hyperlipidemia     intolerant of simvastatin  . Vitamin B12 deficiency 2010    IM therapy at one time but switched to oral in early 2012 and levels ok  . Rotator cuff tendonitis 09/02/2012  . Chronic constipation     does well on 2 OTC stool softeners per night    Past Surgical History  Procedure Laterality Date  . Cholecystectomy  04/05/02    lap chole  . Tonsillectomy    . Abdominal hysterectomy  05/26/06    (pathology benign)  . Salpingoophorectomy  05/26/06    Right w/hysterectomy (pathology benign)  . Salpingoophorectomy  12/09/04    Left (pathology benign)  . Colonoscopy  2002; 2014    Int/ext hemorr, o/w normal.  Normal repeat colonoscopy 03/16/13 by Dr. Loreta Ave.  Recall 2024.    Family History  Problem Relation Age of Onset  . Diabetes Mother   . Heart disease Mother     CABG  . Hyperlipidemia Mother   . Hypertension Mother   . Cancer Father     lung  . Alcohol abuse Father   . Heart disease Maternal Grandmother   . Hyperlipidemia Maternal Grandmother   . Hypertension Maternal Grandmother   . Stroke Maternal Grandmother   .  Diabetes Maternal Grandmother     Social History   Social History  . Marital Status: Married    Spouse Name: N/A  . Number of Children: N/A  . Years of Education: N/A   Occupational History  . Not on file.   Social History Main Topics  . Smoking status: Never Smoker   . Smokeless tobacco: Never Used  . Alcohol Use: No  . Drug Use: Not on file  . Sexual Activity: Yes    Birth Control/ Protection: Surgical   Other Topics Concern  . Not on file   Social History Narrative   Married, 2 children, 3 grandchildren.   Works as a Engineer, civil (consulting) for [employer], also part time in [employer] [facility] and [facility].   No T/A/Ds.  Walks 1-3 miles daily.    Outpatient Prescriptions Prior to Visit  Medication Sig Dispense Refill  . atorvastatin (LIPITOR) 40 MG tablet Take 1 tablet by mouth  daily 90 tablet 0  . cyanocobalamin 500 MCG tablet Take 500 mcg by mouth daily.    Tery Sanfilippo Calcium (STOOL SOFTENER PO) Take 2 tablets by mouth at bedtime.     Jodi [name] estradiol (ESTRACE) 1 MG tablet Take 1 tablet by mouth daily.    . Omega-3 Fatty Acids (FISH OIL) 1200  MG CAPS Take 2 capsules by mouth daily.     Jodi [name]. omeprazole (PRILOSEC) 40 MG capsule Take 1 capsule by mouth daily.     No facility-administered medications prior to visit.    Allergies  Allergen Reactions  . Codeine Other (See Comments)    Skin crawls    ROS Review of Systems  Constitutional: Negative for fever, chills, appetite change and fatigue.  HENT: Negative for congestion, dental problem, ear pain and sore throat.   Eyes: Negative for discharge, redness and visual disturbance.  Respiratory: Negative for cough, chest tightness, shortness of breath and wheezing.   Cardiovascular: Negative for chest pain, palpitations and leg swelling.  Gastrointestinal: Negative for nausea, vomiting, abdominal pain, diarrhea and blood in stool.  Genitourinary: Negative for dysuria, urgency, frequency, hematuria, flank pain and difficulty urinating.   Musculoskeletal: Negative for myalgias, back pain, joint swelling, arthralgias and neck stiffness.  Skin: Negative for pallor and rash.  Neurological: Negative for dizziness, speech difficulty, weakness and headaches.  Hematological: Negative for adenopathy. Does not bruise/bleed easily.  Psychiatric/Behavioral: Negative for confusion and sleep disturbance. The patient is not nervous/anxious.     PE; Blood pressure 100/74, pulse 69, temperature 98 F (36.7 C), temperature source Oral, resp. rate 16, height 5' 5.25" (1.657 m), weight 271 lb 8 oz (123.152 kg), last menstrual period 05/10/2006, SpO2 96 %. Gen: Alert, well appearing, obese-appearing WF in NAD.  Patient is oriented to person, place, time, and situation. AFFECT: pleasant, lucid thought and speech. ENT: Ears: EACs clear, normal epithelium.  TMs with good light reflex and landmarks bilaterally.  Eyes: no injection, icteris, swelling, or exudate.  EOMI, PERRLA. Nose: no drainage or turbinate edema/swelling.  No injection or focal lesion.  Mouth: lips without lesion/swelling.  Oral mucosa pink and moist.  Dentition intact and without obvious caries or gingival swelling.  Oropharynx without erythema, exudate, or swelling.  Neck: supple/nontender.  No LAD, mass, or TM.  Carotid pulses 2+ bilaterally, without bruits. CV: RRR, no m/r/g.   LUNGS: CTA bilat, nonlabored resps, good aeration in all lung fields. ABD: soft, NT, ND, BS normal.  No hepatospenomegaly or mass.  No bruits. EXT: no clubbing, cyanosis, or edema.  Musculoskeletal: no joint swelling, erythema, warmth, or tenderness.  ROM of all joints intact. Skin - no sores or suspicious lesions or rashes or color changes   Pertinent labs:  Lab Results  Component Value Date   TSH 2.48 01/09/2015   Lab Results  Component Value Date   WBC 5.8 01/09/2015   HGB 12.9 01/09/2015   HCT 38.2 01/09/2015   MCV 95.2 01/09/2015   PLT 217.0 01/09/2015   Lab Results  Component Value  Date   CREATININE 0.76 01/09/2015   BUN 12 01/09/2015   NA 140 01/09/2015   K 4.1 01/09/2015   CL 103 01/09/2015   CO2 33* 01/09/2015   Lab Results  Component Value Date   ALT 23 01/09/2015   AST 17 01/09/2015   ALKPHOS 74 01/09/2015   BILITOT 0.6 01/09/2015   Lab Results  Component Value Date   CHOL 161 01/09/2015   Lab Results  Component Value Date   HDL 58.40 01/09/2015   Lab Results  Component Value Date   LDLCALC 81 01/09/2015   Lab Results  Component Value Date   TRIG 109.0 01/09/2015   Lab Results  Component Value Date   CHOLHDL 3 01/09/2015   Lab Results  Component Value Date   VITAMINB12 751 04/07/2012  ASSESSMENT AND PLAN:   Health maintenance exam: Reviewed age and gender appropriate health maintenance issues (prudent diet, regular exercise, health risks of tobacco and excessive alcohol, use of seatbelts, fire alarms in home, use of sunscreen).  Also reviewed age and gender appropriate health screening as well as vaccine recommendations. Vaccines UTD. Fasting HP labs drawn today. Hx of vit B12 deficiency and was on IM replacement at one point in time, has been on oral last couple years. Will check vit B12 level today. She declined hep C and HIV screening today--wants to check about insurance coverage first. Cerv ca and breast ca screening UTD: will get records from GYN. Colon ca screening: next colonoscopy due 2024.  An After Visit Summary was printed and given to the patient.  FOLLOW UP:  Return in about 1 year (around 05/22/2017) for annual CPE (fasting).  Signed:  Santiago Bumpers, MD           05/22/2016

## 2016-05-23 ENCOUNTER — Other Ambulatory Visit: Payer: Self-pay | Admitting: *Deleted

## 2016-05-23 ENCOUNTER — Telehealth: Payer: Self-pay | Admitting: *Deleted

## 2016-05-23 DIAGNOSIS — R7989 Other specified abnormal findings of blood chemistry: Secondary | ICD-10-CM

## 2016-05-23 LAB — COMPREHENSIVE METABOLIC PANEL
ALBUMIN: 3.8 g/dL (ref 3.5–5.2)
ALT: 20 U/L (ref 0–35)
AST: 16 U/L (ref 0–37)
Alkaline Phosphatase: 96 U/L (ref 39–117)
BUN: 17 mg/dL (ref 6–23)
CALCIUM: 9.1 mg/dL (ref 8.4–10.5)
CHLORIDE: 104 meq/L (ref 96–112)
CO2: 27 mEq/L (ref 19–32)
CREATININE: 0.88 mg/dL (ref 0.40–1.20)
GFR: 71.23 mL/min (ref >60.00)
Glucose, Bld: 99 mg/dL (ref 70–99)
Potassium: 4.7 mEq/L (ref 3.5–5.1)
Sodium: 141 mEq/L (ref 135–145)
Total Bilirubin: 0.4 mg/dL (ref 0.2–1.2)
Total Protein: 6.5 g/dL (ref 6.0–8.3)

## 2016-05-23 LAB — LIPID PANEL
CHOLESTEROL: 202 mg/dL — AB (ref 0–200)
HDL: 63.3 mg/dL (ref >39.00)
LDL CALC: 108 mg/dL — AB (ref 0–99)
NonHDL: 138.87
TRIGLYCERIDES: 155 mg/dL — AB (ref 0.0–149.0)
Total CHOL/HDL Ratio: 3
VLDL: 31 mg/dL (ref 0.0–40.0)

## 2016-05-23 NOTE — Telephone Encounter (Signed)
Pt called stating that she received a message from [place] that Dr. Milinda CaveMcGowen said her labs were normal but when she looked on mychart she noticed that her TSH has went up some since her last check a year ago and it also out of the referance range. She stated that she is a little concerned about this. Please advise. Thanks.

## 2016-05-23 NOTE — Telephone Encounter (Signed)
Pt advised and voiced understanding.  Per Dr. Milinda CaveMcGowen also order Free T4 and T3 with TSH. Future labs ordered. Lab apt made for 07/04/16 at 8:30am.

## 2016-05-23 NOTE — Telephone Encounter (Signed)
Tell her I saw this and due to it being very close to upper limit of normal, I was not worried about it.  It is pretty common for the TSH measurement to "jump around" a little when you check it every year. If she wants, we can repeat this lab in 6 weeks to see if it is any different.  Reassure pt, please.-thx

## 2016-07-04 ENCOUNTER — Other Ambulatory Visit (INDEPENDENT_AMBULATORY_CARE_PROVIDER_SITE_OTHER): Payer: 59

## 2016-07-04 DIAGNOSIS — R946 Abnormal results of thyroid function studies: Secondary | ICD-10-CM | POA: Diagnosis not present

## 2016-07-04 DIAGNOSIS — R7989 Other specified abnormal findings of blood chemistry: Secondary | ICD-10-CM

## 2016-07-04 LAB — T4, FREE: FREE T4: 0.67 ng/dL (ref 0.60–1.60)

## 2016-07-04 LAB — TSH: TSH: 3.51 u[IU]/mL (ref 0.35–4.50)

## 2016-07-04 LAB — T3, FREE: T3, Free: 2.9 pg/mL (ref 2.3–4.2)

## 2016-08-15 ENCOUNTER — Other Ambulatory Visit: Payer: Self-pay | Admitting: Family Medicine

## 2016-10-29 ENCOUNTER — Encounter: Payer: Self-pay | Admitting: Family Medicine

## 2016-12-09 ENCOUNTER — Encounter: Payer: Self-pay | Admitting: *Deleted

## 2017-01-26 ENCOUNTER — Other Ambulatory Visit: Payer: Self-pay | Admitting: Family Medicine

## 2017-01-27 NOTE — Telephone Encounter (Signed)
[facility].  RF request for atorvastatin LOV: 05/22/16 Next ov: 05/25/17 Last written: 08/15/16 #90 w/ 1RF

## 2017-03-10 ENCOUNTER — Encounter: Payer: Self-pay | Admitting: Genetic Counselor

## 2017-03-10 ENCOUNTER — Telehealth: Payer: Self-pay | Admitting: Genetic Counselor

## 2017-03-10 NOTE — Telephone Encounter (Signed)
Appt has been scheduled for the pt to see Jodi [name] for genetic counseling on 5/16 at 10am. Pt aware to arrive early. Demographics verified. Letter mailed.

## 2017-03-25 ENCOUNTER — Ambulatory Visit (HOSPITAL_BASED_OUTPATIENT_CLINIC_OR_DEPARTMENT_OTHER): Payer: 59 | Admitting: Genetic Counselor

## 2017-03-25 ENCOUNTER — Other Ambulatory Visit: Payer: 59

## 2017-03-25 ENCOUNTER — Encounter: Payer: Self-pay | Admitting: Genetic Counselor

## 2017-03-25 DIAGNOSIS — Z803 Family history of malignant neoplasm of breast: Secondary | ICD-10-CM | POA: Insufficient documentation

## 2017-03-25 DIAGNOSIS — Z315 Encounter for genetic counseling: Secondary | ICD-10-CM | POA: Diagnosis not present

## 2017-03-25 DIAGNOSIS — Z800 Family history of malignant neoplasm of digestive organs: Secondary | ICD-10-CM

## 2017-03-25 DIAGNOSIS — Z801 Family history of malignant neoplasm of trachea, bronchus and lung: Secondary | ICD-10-CM | POA: Diagnosis not present

## 2017-03-25 DIAGNOSIS — Z8481 Family history of carrier of genetic disease: Secondary | ICD-10-CM

## 2017-03-25 NOTE — Progress Notes (Signed)
REFERRING PROVIDER: Tammi Sou, MD [address] [place],  [address]  PRIMARY PROVIDER:  Tammi Sou, MD  PRIMARY REASON FOR VISIT:  1. Family history of breast cancer   2. Family history of BRCA2 gene positive      HISTORY OF PRESENT ILLNESS:   Jodi [name], a 56 y.o. female, was seen for a White cancer genetics consultation at the request of Dr. Anitra Lauth due to a family history of cancer.  Jodi [name] presents to clinic today to discuss the possibility of a hereditary predisposition to cancer, genetic testing, and to further clarify her future cancer risks, as well as potential cancer risks for family members. Jodi [name] is a 56 y.o. female with no personal history of cancer.    CANCER HISTORY:   No history exists.     HORMONAL RISK FACTORS:  Menarche was at age 38.  First live birth at age 56.  OCP use for approximately 25 years.  Ovaries intact: no.  Hysterectomy: yes.  Menopausal status: postmenopausal.  HRT use: 11 years. Colonoscopy: yes; normal. Mammogram within the last year: yes. Number of breast biopsies: 0. Up to date with pelvic exams:  yes. Any excessive radiation exposure in the past:  no  Past Medical History:  Diagnosis Date  . Chronic constipation    does well on 2 OTC stool softeners per night  . Family history of BRCA2 gene positive   . Family history of breast cancer   . GERD (gastroesophageal reflux disease)    w/ hiatal hernia.  EGD 4/172002 (Dr. Collene Mares) normal biopsy of esophagus  . Hemorrhoids 02/24/01   Colonoscopy; Dr. Collene Mares  . Hyperlipidemia    intolerant of simvastatin  . Menopausal symptom    Dr. Philis Pique has her on estradiol 12m qd as of 10/2016  . Palpitations    ECHO 09/2010 normal.  Rarely has to take 187mpropranolol prn palpitations.  . Plantar fasciitis 2017  . Rotator cuff tendonitis 09/02/2012  . Vitamin B12 deficiency 2010   IM therapy at one time but switched to oral in early 2012 and levels ok     Past Surgical History:  Procedure Laterality Date  . ABDOMINAL HYSTERECTOMY  05/26/06   (pathology benign)  . CHOLECYSTECTOMY  04/05/02   lap chole  . COLONOSCOPY  2002; 2014   Int/ext hemorr, o/w normal.  Normal repeat colonoscopy 03/16/13 by Dr. MaCollene Mares Recall 2024.  . Marland KitchenALPINGOOPHORECTOMY  05/26/06   Right w/hysterectomy (pathology benign)  . SALPINGOOPHORECTOMY  12/09/04   Left (pathology benign)  . TONSILLECTOMY      Social History   Social History  . Marital status: Married    Spouse name: N/A  . Number of children: N/A  . Years of education: N/A   Social History Main Topics  . Smoking status: Never Smoker  . Smokeless tobacco: Never Used  . Alcohol use No  . Drug use: Unknown  . Sexual activity: Yes    Birth control/ protection: Surgical   Other Topics Concern  . None   Social History Narrative   Married, 2 children, 3 grandchildren.   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   No T/A/Ds.  Walks 1-3 miles daily.     FAMILY HISTORY:  We obtained a detailed, 4-generation family history.  Significant diagnoses are listed below: Family History  Problem Relation Age of Onset  . Diabetes Mother   . Heart disease Mother  CABG  . Hyperlipidemia Mother   . Hypertension Mother   . Cancer Father        lung  . Alcohol abuse Father   . Melanoma Father 71       on his nose  . Heart disease Maternal Grandmother   . Hyperlipidemia Maternal Grandmother   . Hypertension Maternal Grandmother   . Stroke Maternal Grandmother   . Diabetes Maternal Grandmother   . Lung cancer Maternal Uncle   . Lung cancer Paternal Uncle   . Heart disease Maternal Grandfather   . Lung cancer Paternal Grandfather   . Pancreatic cancer Paternal Uncle        died in his 50s  . Breast cancer Cousin        died at 81  . BRCA 1/2 Cousin        BRCA2 pos  . Breast cancer Cousin 62       paternal first cousin's daughter  . BRCA 1/2 Cousin         BRCA2 pos    The patient has two children who are cancer free.  She has a brother who is cancer free.  Her father had melanoma at 26 and died of lung cancer.  He was a former smoker.  Her mother died of congestive heart failure at 31.  The patient's father had five brothers.  One brother died of lung cancer and a second brother died of pancreatic cancer in his 40's.  This brother had two daughters and three sons.  One daughter had breast cancer and died at 17, the other daughter tested positive for a BRCA2 mutation after her daughter was diagnosed with breast cancer and was identified to have a BRCA2 mutation.  The patient's paternal grandfather died of lung cancer and her grandmother died of old age.  The patient's mother had six brothers and one sister.  One brother died of lung cancer.  There is no other reported family history of cancer.  Patient's ancestors are of Caucasian descent. There is no reported Ashkenazi Jewish ancestry. There is no known consanguinity.  GENETIC COUNSELING ASSESSMENT: Jodi [name] is a 56 y.o. female with a family history of breast cancer and a BRCA2 mutation which is somewhat suggestive of a hereditary breast and ovarian cancer syndrome and predisposition to cancer. We, therefore, discussed and recommended the following at today's visit.   DISCUSSION: We discussed that about 5-10% of breast cancer is hereditary with most cases due to BRCA mutations.  There are other hereditary genes associated with young breast cancer and pancreatic cancer including PALB2 and ATM.  Based on the family history, the patient has a 12.5% chance of having the same BRCA2 mutation that is in her cousins.  In up to 6% of patients with one hereditary mutation, we find a second.  Therefore we offered the patient a panel test.  We reviewed the characteristics, features and inheritance patterns of hereditary cancer syndromes. We also discussed genetic testing, including the appropriate family  members to test, the process of testing, insurance coverage and turn-around-time for results. We discussed the implications of a negative, positive and/or variant of uncertain significant result. We recommended Jodi [name] pursue genetic testing for the common hereditary cancer gene panel. The Hereditary Gene Panel offered by [facility] includes sequencing and/or deletion duplication testing of the following 46 genes: APC, ATM, AXIN2, BARD1, BMPR1A, BRCA1, BRCA2, BRIP1, CDH1, CDKN2A (p14ARF), CDKN2A (p16INK4a), CHEK2, CTNNA1, DICER1, EPCAM (Deletion/duplication testing only), GREM1 (promoter region deletion/duplication testing  only), KIT, MEN1, MLH1, MSH2, MSH3, MSH6, MUTYH, NBN, NF1, NHTL1, PALB2, PDGFRA, PMS2, POLD1, POLE, PTEN, RAD50, RAD51C, RAD51D, SDHB, SDHC, SDHD, SMAD4, SMARCA4. STK11, TP53, TSC1, TSC2, and VHL.  The following genes were evaluated for sequence changes only: SDHA and HOXB13 c.251G>A variant only.  Based on Jodi [name]'s family history of cancer, she meets medical criteria for genetic testing. Despite that she meets criteria, she may still have an out of pocket cost. We discussed that if her out of pocket cost for testing is over $100, the laboratory will call and confirm whether she wants to proceed with testing.  If the out of pocket cost of testing is less than $100 she will be billed by the genetic testing laboratory.   PLAN: After considering the risks, benefits, and limitations, Jodi [name]  provided informed consent to pursue genetic testing and the blood sample was sent to [facility] for analysis of the Common Hereditary cancer panel. Results should be available within approximately 2-3 weeks' time, at which point they will be disclosed by telephone to Jodi [name], as will any additional recommendations warranted by these results. Jodi [name] will receive a summary of her genetic counseling visit and a copy of her results once available. This information will also be  available in Epic. We encouraged Jodi [name] to remain in contact with cancer genetics annually so that we can continuously update the family history and inform her of any changes in cancer genetics and testing that may be of benefit for her family. Jodi [name] questions were answered to her satisfaction today. Our contact information was provided should additional questions or concerns arise.  Lastly, we encouraged Jodi [name] to remain in contact with cancer genetics annually so that we can continuously update the family history and inform her of any changes in cancer genetics and testing that may be of benefit for this family.   Ms.  [name] questions were answered to her satisfaction today. Our contact information was provided should additional questions or concerns arise. Thank you for the referral and allowing [place] to share in the care of your patient.   Karen P. Florene Glen, [place], [facility] Certified Genetic Counselor [email or web address] phone: [phone]  The patient was seen for a total of 60 minutes in face-to-face genetic counseling.  This patient was discussed with Drs. Magrinat, Lindi Adie and/or Burr Medico who agrees with the above.    _______________________________________________________________________ For Office Staff:  Number of people involved in session: 1 Was an Intern/ student involved with case: no

## 2017-04-02 ENCOUNTER — Encounter: Payer: Self-pay | Admitting: Family Medicine

## 2017-04-09 ENCOUNTER — Ambulatory Visit: Payer: Self-pay | Admitting: Genetic Counselor

## 2017-04-09 ENCOUNTER — Encounter: Payer: Self-pay | Admitting: Genetic Counselor

## 2017-04-09 ENCOUNTER — Telehealth: Payer: Self-pay | Admitting: Genetic Counselor

## 2017-04-09 DIAGNOSIS — Z8481 Family history of carrier of genetic disease: Secondary | ICD-10-CM

## 2017-04-09 DIAGNOSIS — Z1379 Encounter for other screening for genetic and chromosomal anomalies: Secondary | ICD-10-CM

## 2017-04-09 DIAGNOSIS — Z803 Family history of malignant neoplasm of breast: Secondary | ICD-10-CM

## 2017-04-09 NOTE — Telephone Encounter (Signed)
Revealed negative genetic testing, including negative for the [facility] in BRCA2.  I released a copy of her results to her.

## 2017-04-09 NOTE — Progress Notes (Addendum)
HPI: Ms. Thien was previously seen in the [facility] due to a family history of cancer and concerns regarding a hereditary predisposition to cancer, specifically a BRCA2 mutation which is known to run in the family. Please refer to our prior cancer genetics clinic note for more information regarding Ms. Heath's medical, social and family histories, and our assessment and recommendations, at the time. Ms. Carstens recent genetic test results were disclosed to her, as were recommendations warranted by these results. These results and recommendations are discussed in more detail below.  CANCER HISTORY:   No history exists.    FAMILY HISTORY:  We obtained a detailed, 4-generation family history.  Significant diagnoses are listed below: Family History  Problem Relation Age of Onset  . Diabetes Mother   . Heart disease Mother        CABG  . Hyperlipidemia Mother   . Hypertension Mother   . Cancer Father        lung  . Alcohol abuse Father   . Melanoma Father 13       on his nose  . Heart disease Maternal Grandmother   . Hyperlipidemia Maternal Grandmother   . Hypertension Maternal Grandmother   . Stroke Maternal Grandmother   . Diabetes Maternal Grandmother   . Lung cancer Maternal Uncle   . Lung cancer Paternal Uncle   . Heart disease Maternal Grandfather   . Lung cancer Paternal Grandfather   . Pancreatic cancer Paternal Uncle        died in his 33s  . Breast cancer Cousin        died at 31  . BRCA 1/2 Cousin        BRCA2 pos  . Breast cancer Cousin 72       paternal first cousin's daughter  . BRCA 1/2 Cousin        BRCA2 pos    The patient has two children who are cancer free.  She has a brother who is cancer free.  Her father had melanoma at 29 and died of lung cancer.  He was a former smoker.  Her mother died of congestive heart failure at 66.  The patient's father had five brothers.  One brother died of lung cancer and a second brother died of  pancreatic cancer in his 2's.  This brother had two daughters and three sons.  One daughter had breast cancer and died at 75, the other daughter tested positive for a BRCA2 mutation after her daughter was diagnosed with breast cancer and was identified to have a BRCA2 mutation.  The patient's paternal grandfather died of lung cancer and her grandmother died of old age.  The patient's mother had six brothers and one sister.  One brother died of lung cancer.  There is no other reported family history of cancer.  Patient's ancestors are of Caucasian descent. There is no reported Ashkenazi Jewish ancestry. There is no known consanguinity.  GENETIC TEST RESULTS: Genetic testing reported out on Apr 07, 2017 through the Common Hereditary cancer panel found no deleterious mutations.  The Hereditary Gene Panel offered by [facility] includes sequencing and/or deletion duplication testing of the following 46 genes: APC, ATM, AXIN2, BARD1, BMPR1A, BRCA1, BRCA2, BRIP1, CDH1, CDKN2A (p14ARF), CDKN2A (p16INK4a), CHEK2, CTNNA1, DICER1, EPCAM (Deletion/duplication testing only), GREM1 (promoter region deletion/duplication testing only), KIT, MEN1, MLH1, MSH2, MSH3, MSH6, MUTYH, NBN, NF1, NHTL1, PALB2, PDGFRA, PMS2, POLD1, POLE, PTEN, RAD50, RAD51C, RAD51D, SDHB, SDHC, SDHD, SMAD4, SMARCA4. STK11, TP53, TSC1,  TSC2, and VHL.  The following genes were evaluated for sequence changes only: SDHA and HOXB13 c.251G>A variant only.  The test report has been scanned into EPIC and is located under the Molecular Pathology section of the Results Review tab.   Ms. Cardona test was normal and did not reveal the familial mutation. We call this result a true negative result because the cancer-causing mutation was identified in Ms. Vigilante's family, and she did not inherit it.  Given this negative result, Ms. Kivi chances of developing BRCA-related cancers are the same as they are in the general population.      CANCER SCREENING  RECOMMENDATIONS:  This normal result is reassuring and indicates that Ms. Rightmyer does not likely have an increased risk of cancer due to a mutation in one of these genes.  We, therefore, recommended  Ms. Casa continue to follow the cancer screening guidelines provided by her primary healthcare providers.   FOLLOW-UP: Lastly, we discussed with Ms. Sabas that cancer genetics is a rapidly advancing field and it is possible that new genetic tests will be appropriate for her and/or her family members in the future. We encouraged her to remain in contact with cancer genetics on an annual basis so we can update her personal and family histories and let her know of advances in cancer genetics that may benefit this family.   Our contact number was provided. Ms. Wojnar questions were answered to her satisfaction, and she knows she is welcome to call us at anytime with additional questions or concerns.   Roma Kayser, MS, [facility] Certified Genetic Counselor [email or web address]

## 2017-04-17 ENCOUNTER — Encounter: Payer: Self-pay | Admitting: Family Medicine

## 2017-05-25 ENCOUNTER — Encounter: Payer: Self-pay | Admitting: Family Medicine

## 2017-05-25 ENCOUNTER — Ambulatory Visit (INDEPENDENT_AMBULATORY_CARE_PROVIDER_SITE_OTHER): Payer: 59 | Admitting: Family Medicine

## 2017-05-25 VITALS — BP 108/73 | HR 65 | Temp 98.0°F | Resp 16 | Ht 65.5 in | Wt 284.5 lb

## 2017-05-25 DIAGNOSIS — Z0000 Encounter for general adult medical examination without abnormal findings: Secondary | ICD-10-CM

## 2017-05-25 DIAGNOSIS — E7800 Pure hypercholesterolemia, unspecified: Secondary | ICD-10-CM

## 2017-05-25 LAB — LIPID PANEL
CHOL/HDL RATIO: 3
CHOLESTEROL: 189 mg/dL (ref 0–200)
HDL: 58.3 mg/dL (ref >39.00)
LDL CALC: 93 mg/dL (ref 0–99)
NONHDL: 130.8
Triglycerides: 188 mg/dL — ABNORMAL HIGH (ref 0.0–149.0)
VLDL: 37.6 mg/dL (ref 0.0–40.0)

## 2017-05-25 LAB — COMPREHENSIVE METABOLIC PANEL
ALBUMIN: 3.8 g/dL (ref 3.5–5.2)
ALT: 19 U/L (ref 0–35)
AST: 15 U/L (ref 0–37)
Alkaline Phosphatase: 86 U/L (ref 39–117)
BUN: 11 mg/dL (ref 6–23)
CHLORIDE: 104 meq/L (ref 96–112)
CO2: 28 meq/L (ref 19–32)
CREATININE: 0.81 mg/dL (ref 0.40–1.20)
Calcium: 9.3 mg/dL (ref 8.4–10.5)
GFR: 78.08 mL/min (ref >60.00)
GLUCOSE: 94 mg/dL (ref 70–99)
POTASSIUM: 4.5 meq/L (ref 3.5–5.1)
Sodium: 140 mEq/L (ref 135–145)
Total Bilirubin: 0.4 mg/dL (ref 0.2–1.2)
Total Protein: 6.5 g/dL (ref 6.0–8.3)

## 2017-05-25 LAB — CBC WITH DIFFERENTIAL/PLATELET
BASOS PCT: 1.4 % (ref 0.0–3.0)
Basophils Absolute: 0.1 10*3/uL (ref 0.0–0.1)
EOS ABS: 0.1 10*3/uL (ref 0.0–0.7)
EOS PCT: 1 % (ref 0.0–5.0)
HCT: 37.2 % (ref 36.0–46.0)
HEMOGLOBIN: 12.6 g/dL (ref 12.0–15.0)
LYMPHS PCT: 38.4 % (ref 12.0–46.0)
Lymphs Abs: 2.3 10*3/uL (ref 0.7–4.0)
MCHC: 33.8 g/dL (ref 30.0–36.0)
MCV: 96.5 fl (ref 78.0–100.0)
MONO ABS: 0.4 10*3/uL (ref 0.1–1.0)
Monocytes Relative: 6.2 % (ref 3.0–12.0)
NEUTROS ABS: 3.2 10*3/uL (ref 1.4–7.7)
Neutrophils Relative %: 53 % (ref 43.0–77.0)
PLATELETS: 235 10*3/uL (ref 150.0–400.0)
RBC: 3.85 Mil/uL — ABNORMAL LOW (ref 3.87–5.11)
RDW: 13.4 % (ref 11.5–15.5)
WBC: 6.1 10*3/uL (ref 4.0–10.5)

## 2017-05-25 LAB — TSH: TSH: 4.89 u[IU]/mL — ABNORMAL HIGH (ref 0.35–4.50)

## 2017-05-25 NOTE — Patient Instructions (Signed)

## 2017-05-25 NOTE — Progress Notes (Signed)
Office Note 05/25/2017  CC:  Chief Complaint  Patient presents with  . Annual Exam    Pt is fasting.    HPI:  Jodi [name] is a 56 y.o. White female who is here for annual health maintenance exam. GYN MD is Dr. Claudette Head and mammogram UTD.  Eyes: annual exam UTD. Dental: preventatives UTD.   Past Medical History:  Diagnosis Date  . Chronic constipation    does well on 2 OTC stool softeners per night  . Family history of BRCA2 gene positive   . Family history of breast cancer    Medical genetics eval 03/2017: genetic screening panel to better assess/estimate her future risk of developing cancer came back "negative"--indicating that she has NO increased risk and she should be screened for approp cancers as average risk pt.(as of 04/2017).  . GERD (gastroesophageal reflux disease)    w/ hiatal hernia.  EGD 4/172002 (Dr. Collene Mares) normal biopsy of esophagus  . Hemorrhoids 02/24/01   Colonoscopy; Dr. Collene Mares  . Hyperlipidemia    intolerant of simvastatin  . Menopausal symptom    Dr. Philis Pique has her on estradiol 55m qd as of 10/2016  . [place]   . Palpitations    ECHO 09/2010 normal.  Rarely has to take 15mpropranolol prn palpitations.  . Plantar fasciitis 2017  . Rotator cuff tendonitis 09/02/2012  . Vitamin B12 deficiency 2010   IM therapy at one time but switched to oral in early 2012 and levels ok    Past Surgical History:  Procedure Laterality Date  . ABDOMINAL HYSTERECTOMY  05/26/06   (pathology benign)  . CHOLECYSTECTOMY  04/05/02   lap chole  . COLONOSCOPY  2002; 2014   Int/ext hemorr, o/w normal.  Normal repeat colonoscopy 03/16/13 by Dr. MaCollene Mares Recall 2024.  . Marland KitchenALPINGOOPHORECTOMY  05/26/06   Right w/hysterectomy (pathology benign)  . SALPINGOOPHORECTOMY  12/09/04   Left (pathology benign)  . TONSILLECTOMY      Family History  Problem Relation Age of Onset  . Diabetes Mother   . Heart disease Mother        CABG  . Hyperlipidemia Mother   .  Hypertension Mother   . Cancer Father        lung  . Alcohol abuse Father   . Melanoma Father [id number]     on his nose  . Heart disease Maternal Grandmother   . Hyperlipidemia Maternal Grandmother   . Hypertension Maternal Grandmother   . Stroke Maternal Grandmother   . Diabetes Maternal Grandmother   . Lung cancer Maternal Uncle   . Lung cancer Paternal Uncle   . Heart disease Maternal Grandfather   . Lung cancer Paternal Grandfather   . Pancreatic cancer Paternal Uncle        died in his 5070s. Breast cancer Cousin        died at 3523. BRCA 1/2 Cousin        BRCA2 pos  . Breast cancer Cousin 3563     paternal first cousin's daughter  . BRCA 1/2 Cousin        BRCA2 pos    Social History   Social History  . Marital status: Married    Spouse name: N/A  . Number of children: N/A  . Years of education: N/A   Occupational History  . Not on file.   Social History Main Topics  . Smoking status: Never Smoker  . Smokeless tobacco: Never  Used  . Alcohol use No  . Drug use: Unknown  . Sexual activity: Yes    Birth control/ protection: Surgical   Other Topics Concern  . Not on file   Social History Narrative   Married, 2 children, 3 grandchildren.   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   No T/A/Ds.  Walks 1-3 miles daily.    Outpatient Medications Prior to Visit  Medication Sig Dispense Refill  . atorvastatin (LIPITOR) 40 MG tablet TAKE 1 TABLET BY MOUTH  DAILY 90 tablet 1  . cyanocobalamin 500 MCG tablet Take 500 mcg by mouth daily.    Mariane Baumgarten Calcium (STOOL SOFTENER PO) Take 2 tablets by mouth at bedtime.     Marland Kitchen estradiol (ESTRACE) 1 MG tablet Take 1 tablet by mouth daily.    . Omega-3 Fatty Acids (FISH OIL) 1200 MG CAPS Take 2 capsules by mouth daily.     Marland Kitchen omeprazole (PRILOSEC) 40 MG capsule Take 1 capsule by mouth daily.     No facility-administered medications prior to visit.     Allergies  Allergen Reactions  . Codeine  Other (See Comments)    Skin crawls    ROS Review of Systems  Constitutional: Negative for appetite change, chills, fatigue and fever.  HENT: Negative for congestion, dental problem, ear pain and sore throat.   Eyes: Negative for discharge, redness and visual disturbance.  Respiratory: Negative for cough, chest tightness, shortness of breath and wheezing.   Cardiovascular: Negative for chest pain, palpitations and leg swelling.  Gastrointestinal: Negative for abdominal pain, blood in stool, diarrhea, nausea and vomiting.  Genitourinary: Negative for difficulty urinating, dysuria, flank pain, frequency, hematuria and urgency.  Musculoskeletal: Negative for arthralgias, back pain, joint swelling, myalgias and neck stiffness.  Skin: Negative for pallor and rash.  Neurological: Negative for dizziness, speech difficulty, weakness and headaches.  Hematological: Negative for adenopathy. Does not bruise/bleed easily.  Psychiatric/Behavioral: Negative for confusion and sleep disturbance. The patient is not nervous/anxious.     PE; Blood pressure 108/73, pulse 65, temperature 98 F (36.7 C), temperature source Oral, resp. rate 16, height 5' 5.5" (1.664 m), weight 284 lb 8 oz (129 kg), last menstrual period 05/10/2006, SpO2 95 %. Body mass index is 46.62 kg/m. Pt examined with Sharen Hones, CMA, as chaperone.  Gen: Alert, well appearing.  Patient is oriented to person, place, time, and situation. AFFECT: pleasant, lucid thought and speech. ENT: Ears: EACs clear, normal epithelium.  TMs with good light reflex and landmarks bilaterally.  Eyes: no injection, icteris, swelling, or exudate.  EOMI, PERRLA. Nose: no drainage or turbinate edema/swelling.  No injection or focal lesion.  Mouth: lips without lesion/swelling.  Oral mucosa pink and moist.  Dentition intact and without obvious caries or gingival swelling.  Oropharynx without erythema, exudate, or swelling.  Neck: supple/nontender.  No LAD,  mass, or TM.  Carotid pulses 2+ bilaterally, without bruits. CV: RRR, no m/r/g.   LUNGS: CTA bilat, nonlabored resps, good aeration in all lung fields. ABD: soft, NT, ND, BS normal.  No hepatospenomegaly or mass.  No bruits. EXT: no clubbing, cyanosis, or edema.  Musculoskeletal: no joint swelling, erythema, warmth, or tenderness.  ROM of all joints intact. Skin - no sores or suspicious lesions or rashes or color changes   Pertinent labs:  Lab Results  Component Value Date   TSH 3.51 07/04/2016   Lab Results  Component Value Date   WBC 7.4 05/22/2016  HGB 12.8 05/22/2016   HCT 38.4 05/22/2016   MCV 96.3 05/22/2016   PLT 223.0 05/22/2016   Lab Results  Component Value Date   CREATININE 0.88 05/22/2016   BUN 17 05/22/2016   NA 141 05/22/2016   K 4.7 05/22/2016   CL 104 05/22/2016   CO2 27 05/22/2016   Lab Results  Component Value Date   ALT 20 05/22/2016   AST 16 05/22/2016   ALKPHOS 96 05/22/2016   BILITOT 0.4 05/22/2016   Lab Results  Component Value Date   CHOL 202 (H) 05/22/2016   Lab Results  Component Value Date   HDL 63.30 05/22/2016   Lab Results  Component Value Date   LDLCALC 108 (H) 05/22/2016   Lab Results  Component Value Date   TRIG 155.0 (H) 05/22/2016   Lab Results  Component Value Date   CHOLHDL 3 05/22/2016   Lab Results  Component Value Date   VITAMINB12 826 05/22/2016   ASSESSMENT AND PLAN:   Health maintenance exam: Reviewed age and gender appropriate health maintenance issues (prudent diet, regular exercise, health risks of tobacco and excessive alcohol, use of seatbelts, fire alarms in home, use of sunscreen).  Also reviewed age and gender appropriate health screening as well as vaccine recommendations. Vaccines:  UTD.  Discussed shingrix--pt declines. LABS: fasting HP labs today. Cervical ca screening: pap smear UTD. Breast ca screening --annual mammogram UTD. Colon ca screening: next colonoscopy due 2024.  An After Visit  Summary was printed and given to the patient.  FOLLOW UP:  Return in about 1 year (around 05/25/2018) for annual CPE (fasting).  Signed:  Crissie Sickles, MD           05/25/2017

## 2017-05-26 ENCOUNTER — Other Ambulatory Visit (INDEPENDENT_AMBULATORY_CARE_PROVIDER_SITE_OTHER): Payer: 59

## 2017-05-26 ENCOUNTER — Encounter: Payer: Self-pay | Admitting: *Deleted

## 2017-05-26 DIAGNOSIS — R946 Abnormal results of thyroid function studies: Secondary | ICD-10-CM

## 2017-05-26 LAB — T3, FREE: T3 FREE: 3.2 pg/mL (ref 2.3–4.2)

## 2017-05-26 LAB — T4, FREE: Free T4: 0.69 ng/dL (ref 0.60–1.60)

## 2017-07-11 ENCOUNTER — Other Ambulatory Visit: Payer: Self-pay | Admitting: Family Medicine

## 2017-07-30 ENCOUNTER — Telehealth: Payer: Self-pay | Admitting: *Deleted

## 2017-07-30 NOTE — Telephone Encounter (Signed)
Health form for [facility] completed and faxed to number on form.  Copy made for fax pile, will keep x 1 month then will put in shred box.   Original sent to be scanned into pts chart.   Pt advised and voiced understanding.

## 2017-08-20 ENCOUNTER — Ambulatory Visit (INDEPENDENT_AMBULATORY_CARE_PROVIDER_SITE_OTHER): Payer: 59

## 2017-08-20 DIAGNOSIS — Z23 Encounter for immunization: Secondary | ICD-10-CM

## 2017-11-17 ENCOUNTER — Encounter: Payer: Self-pay | Admitting: *Deleted

## 2017-11-17 DIAGNOSIS — N951 Menopausal and female climacteric states: Secondary | ICD-10-CM | POA: Insufficient documentation

## 2017-11-18 LAB — HM MAMMOGRAPHY

## 2017-11-19 LAB — HM PAP SMEAR: HM PAP: NEGATIVE

## 2017-11-24 ENCOUNTER — Encounter: Payer: Self-pay | Admitting: Family Medicine

## 2018-02-16 ENCOUNTER — Other Ambulatory Visit: Payer: Self-pay | Admitting: Family Medicine

## 2018-05-27 ENCOUNTER — Encounter: Payer: Self-pay | Admitting: Family Medicine

## 2018-05-27 ENCOUNTER — Ambulatory Visit (INDEPENDENT_AMBULATORY_CARE_PROVIDER_SITE_OTHER): Payer: 59 | Admitting: Family Medicine

## 2018-05-27 VITALS — BP 124/82 | HR 62 | Temp 97.4°F | Resp 16 | Ht 65.5 in | Wt 287.0 lb

## 2018-05-27 DIAGNOSIS — Z0000 Encounter for general adult medical examination without abnormal findings: Secondary | ICD-10-CM | POA: Diagnosis not present

## 2018-05-27 LAB — COMPREHENSIVE METABOLIC PANEL
ALBUMIN: 3.9 g/dL (ref 3.5–5.2)
ALK PHOS: 102 U/L (ref 39–117)
ALT: 22 U/L (ref 0–35)
AST: 16 U/L (ref 0–37)
BILIRUBIN TOTAL: 0.4 mg/dL (ref 0.2–1.2)
BUN: 10 mg/dL (ref 6–23)
CALCIUM: 9.1 mg/dL (ref 8.4–10.5)
CHLORIDE: 104 meq/L (ref 96–112)
CO2: 31 mEq/L (ref 19–32)
CREATININE: 0.82 mg/dL (ref 0.40–1.20)
GFR: 76.7 mL/min (ref >60.00)
Glucose, Bld: 95 mg/dL (ref 70–99)
Potassium: 4.7 mEq/L (ref 3.5–5.1)
SODIUM: 140 meq/L (ref 135–145)
TOTAL PROTEIN: 6.7 g/dL (ref 6.0–8.3)

## 2018-05-27 LAB — CBC WITH DIFFERENTIAL/PLATELET
BASOS PCT: 1.4 % (ref 0.0–3.0)
Basophils Absolute: 0.1 10*3/uL (ref 0.0–0.1)
EOS ABS: 0.1 10*3/uL (ref 0.0–0.7)
EOS PCT: 1.6 % (ref 0.0–5.0)
HEMATOCRIT: 38.1 % (ref 36.0–46.0)
HEMOGLOBIN: 12.6 g/dL (ref 12.0–15.0)
Lymphocytes Relative: 38.9 % (ref 12.0–46.0)
Lymphs Abs: 2.6 10*3/uL (ref 0.7–4.0)
MCHC: 33.2 g/dL (ref 30.0–36.0)
MCV: 97.2 fl (ref 78.0–100.0)
MONO ABS: 0.4 10*3/uL (ref 0.1–1.0)
Monocytes Relative: 6.3 % (ref 3.0–12.0)
Neutro Abs: 3.4 10*3/uL (ref 1.4–7.7)
Neutrophils Relative %: 51.8 % (ref 43.0–77.0)
Platelets: 249 10*3/uL (ref 150.0–400.0)
RBC: 3.92 Mil/uL (ref 3.87–5.11)
RDW: 13.4 % (ref 11.5–15.5)
WBC: 6.6 10*3/uL (ref 4.0–10.5)

## 2018-05-27 LAB — LIPID PANEL
CHOLESTEROL: 196 mg/dL (ref 0–200)
HDL: 58.2 mg/dL (ref >39.00)
NonHDL: 137.34
TRIGLYCERIDES: 203 mg/dL — AB (ref 0.0–149.0)
Total CHOL/HDL Ratio: 3
VLDL: 40.6 mg/dL — ABNORMAL HIGH (ref 0.0–40.0)

## 2018-05-27 LAB — TSH: TSH: 3.26 u[IU]/mL (ref 0.35–4.50)

## 2018-05-27 LAB — LDL CHOLESTEROL, DIRECT: Direct LDL: 108 mg/dL

## 2018-05-27 NOTE — Progress Notes (Signed)
Office Note 05/27/2018  CC:  Chief Complaint  Patient presents with  . Annual Exam    HPI:  Jodi [name] is a 57 y.o. White female who is here for annual health maintenance exam. 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  Exercise: none lately.  R knee pain lately--she says she will call her ortho and request injection. Diet: working on cutting back portion size and make smarter food choices. Dental preventatives UTD. Eyes: due for exam 09/2018.  Feeling very well.   Past Medical History:  Diagnosis Date  . Chronic constipation    does well on 2 OTC stool softeners per night  . Family history of BRCA2 gene positive   . Family history of breast cancer    Medical genetics eval 03/2017: genetic screening panel to better assess/estimate her future risk of developing cancer came back "negative"--indicating that she has NO increased risk and she should be screened for approp cancers as average risk pt.(as of 04/2017).  . GERD (gastroesophageal reflux disease)    w/ hiatal hernia.  EGD 4/172002 (Dr. Collene Mares) normal biopsy of esophagus  . Hemorrhoids 02/24/01   Colonoscopy; Dr. Collene Mares  . Hyperlipidemia    intolerant of simvastatin  . Menopausal symptom    Dr. Philis Pique has her on estradiol 106m qd as of 10/2016  . [place]   . Palpitations    ECHO 09/2010 normal.  Rarely has to take 163mpropranolol prn palpitations.  . Plantar fasciitis 2017  . Rotator cuff tendonitis 09/02/2012  . Subclinical hypothyroidism 2017/18   TSH VERY mildly elevated.  . Vitamin B12 deficiency 2010   IM therapy at one time but switched to oral in early 2012 and levels ok    Past Surgical History:  Procedure Laterality Date  . ABDOMINAL HYSTERECTOMY  05/26/06   (pathology benign)  . CHOLECYSTECTOMY  04/05/02   lap chole  . COLONOSCOPY  2002; 2014   Int/ext hemorr, o/w normal.  Normal repeat colonoscopy 03/16/13 by Dr. MaCollene Mares Recall 2024.  . Marland KitchenALPINGOOPHORECTOMY  05/26/06   Right w/hysterectomy (pathology benign)  . SALPINGOOPHORECTOMY  12/09/04   Left (pathology benign)  . TONSILLECTOMY      Family History  Problem Relation Age of Onset  . Diabetes Mother   . Heart disease Mother        CABG  . Hyperlipidemia Mother   . Hypertension Mother   . Cancer Father        lung  . Alcohol abuse Father   . Melanoma Father [phone]     on his nose  . Heart disease Maternal Grandmother   . Hyperlipidemia Maternal Grandmother   . Hypertension Maternal Grandmother   . Stroke Maternal Grandmother   . Diabetes Maternal Grandmother   . Lung cancer Maternal Uncle   . Lung cancer Paternal Uncle   . Heart disease Maternal Grandfather   . Lung cancer Paternal Grandfather   . Pancreatic cancer Paternal Uncle        died in his 502s. Breast cancer Cousin        died at 3562. BRCA 1/2 Cousin        BRCA2 pos  . Breast cancer Cousin 3548     paternal first cousin's daughter  . BRCA 1/2 Cousin        BRCA2 pos    Social History   Socioeconomic History  . Marital status: Married  Spouse name: Not on file  . Number of children: Not on file  . Years of education: Not on file  . Highest education level: Not on file  Occupational History  . Not on file  Social Needs  . Financial resource strain: Not on file  . Food insecurity:    Worry: Not on file    Inability: Not on file  . Transportation needs:    Medical: Not on file    Non-medical: Not on file  Tobacco Use  . Smoking status: Never Smoker  . Smokeless tobacco: Never Used  Substance and Sexual Activity  . Alcohol use: Yes    Comment: occassionally  . Drug use: Never  . Sexual activity: Yes    Birth control/protection: Surgical  Lifestyle  . Physical activity:    Days per week: Not on file    Minutes per session: Not on file  . Stress: Not on file  Relationships  . Social connections:    Talks on phone: Not on file    Gets together: Not on file    Attends religious service: Not on file     Active member of club or organization: Not on file    Attends meetings of clubs or organizations: Not on file    Relationship status: Not on file  . Intimate partner violence:    Fear of current or ex partner: Not on file    Emotionally abused: Not on file    Physically abused: Not on file    Forced sexual activity: Not on file  Other Topics Concern  . Not on file  Social History Narrative   Married, 2 children, 3 grandchildren.   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   No T/A/Ds.  Walks 1-3 miles daily.    Outpatient Medications Prior to Visit  Medication Sig Dispense Refill  . atorvastatin (LIPITOR) 40 MG tablet TAKE 1 TABLET BY MOUTH  DAILY 90 tablet 1  . cyanocobalamin 500 MCG tablet Take 500 mcg by mouth daily.    Mariane Baumgarten Calcium (STOOL SOFTENER PO) Take 2 tablets by mouth at bedtime.     Marland Kitchen estradiol (ESTRACE) 1 MG tablet Take 1 tablet by mouth daily.    . Omega-3 Fatty Acids (FISH OIL) 1200 MG CAPS Take 2 capsules by mouth daily.     Marland Kitchen omeprazole (PRILOSEC) 40 MG capsule Take 1 capsule by mouth daily.     No facility-administered medications prior to visit.     Allergies  Allergen Reactions  . Codeine Other (See Comments)    Skin crawls    ROS Review of Systems  Constitutional: Negative for appetite change, chills, fatigue and fever.  HENT: Negative for congestion, dental problem, ear pain and sore throat.   Eyes: Negative for discharge, redness and visual disturbance.  Respiratory: Negative for cough, chest tightness, shortness of breath and wheezing.   Cardiovascular: Negative for chest pain, palpitations and leg swelling.  Gastrointestinal: Negative for abdominal pain, blood in stool, diarrhea, nausea and vomiting.  Endocrine:       Sweats easily: at rest or with activity. +Hot flashes at night. "I have always been a big sweater"  Genitourinary: Negative for difficulty urinating, dysuria, flank pain, frequency, hematuria and  urgency.  Musculoskeletal: Negative for arthralgias, back pain, joint swelling, myalgias and neck stiffness.  Skin: Negative for pallor and rash.  Neurological: Negative for dizziness, speech difficulty, weakness and headaches.  Hematological: Negative for adenopathy.  Does not bruise/bleed easily.  Psychiatric/Behavioral: Negative for confusion and sleep disturbance. The patient is not nervous/anxious.     PE; Blood pressure 124/82, pulse 62, temperature (!) 97.4 F (36.3 C), temperature source Oral, resp. rate 16, height 5' 5.5" (1.664 m), weight 287 lb (130.2 kg), last menstrual period 05/10/2006, SpO2 98 %. Body mass index is 47.03 kg/m.  Exam chaperoned by Starla Link, CMA.  Gen: Alert, well appearing.  Patient is oriented to person, place, time, and situation. AFFECT: pleasant, lucid thought and speech. ENT: Ears: EACs clear, normal epithelium.  TMs with good light reflex and landmarks bilaterally.  Eyes: no injection, icteris, swelling, or exudate.  EOMI, PERRLA. Nose: no drainage or turbinate edema/swelling.  No injection or focal lesion.  Mouth: lips without lesion/swelling.  Oral mucosa pink and moist.  Dentition intact and without obvious caries or gingival swelling.  Oropharynx without erythema, exudate, or swelling.  Neck: supple/nontender.  No LAD, mass, or TM.  Carotid pulses 2+ bilaterally, without bruits. CV: RRR, no m/r/g.   LUNGS: CTA bilat, nonlabored resps, good aeration in all lung fields. ABD: soft, NT, ND, BS normal.  No hepatospenomegaly or mass.  No bruits. EXT: no clubbing, cyanosis, or edema.  Musculoskeletal: no joint swelling, erythema, warmth, or tenderness.  ROM of all joints intact. Skin - no sores or suspicious lesions or rashes or color changes   Pertinent labs:  Lab Results  Component Value Date   TSH 4.89 (H) 05/25/2017   Lab Results  Component Value Date   WBC 6.1 05/25/2017   HGB 12.6 05/25/2017   HCT 37.2 05/25/2017   MCV 96.5 05/25/2017    PLT 235.0 05/25/2017   Lab Results  Component Value Date   CREATININE 0.81 05/25/2017   BUN 11 05/25/2017   NA 140 05/25/2017   K 4.5 05/25/2017   CL 104 05/25/2017   CO2 28 05/25/2017   Lab Results  Component Value Date   ALT 19 05/25/2017   AST 15 05/25/2017   ALKPHOS 86 05/25/2017   BILITOT 0.4 05/25/2017   Lab Results  Component Value Date   CHOL 189 05/25/2017   Lab Results  Component Value Date   HDL 58.30 05/25/2017   Lab Results  Component Value Date   LDLCALC 93 05/25/2017   Lab Results  Component Value Date   TRIG 188.0 (H) 05/25/2017   Lab Results  Component Value Date   CHOLHDL 3 05/25/2017   Lab Results  Component Value Date   VITAMINB12 826 05/22/2016    ASSESSMENT AND PLAN:   Health maintenance exam: Reviewed age and gender appropriate health maintenance issues (prudent diet, regular exercise, health risks of tobacco and excessive alcohol, use of seatbelts, fire alarms in home, use of sunscreen).  Also reviewed age and gender appropriate health screening as well as vaccine recommendations. Vaccines: UTD.  Shingrix discussed--pt declines. Labs: fasting HP today. Cervical ca screening: UTD (11/2017) -->f/u as per GYN MD, Dr. Philis Pique. Breast ca screening: UTD (11/2017)-->f/u as per GYN MD, Dr. Philis Pique. Colon ca screening: next colonoscopy due 2024 (Dr. Collene Mares).  An After Visit Summary was printed and given to the patient.  FOLLOW UP:  Return in about 1 year (around 05/28/2019) for annual CPE (fasting).   Signed:  Crissie Sickles, MD           05/27/2018

## 2018-05-27 NOTE — Patient Instructions (Signed)

## 2018-08-13 ENCOUNTER — Other Ambulatory Visit: Payer: Self-pay | Admitting: Family Medicine

## 2019-01-13 LAB — HM MAMMOGRAPHY

## 2019-01-18 ENCOUNTER — Encounter: Payer: Self-pay | Admitting: Family Medicine

## 2019-01-23 ENCOUNTER — Encounter: Payer: Self-pay | Admitting: Family Medicine

## 2019-01-30 ENCOUNTER — Other Ambulatory Visit: Payer: Self-pay | Admitting: Family Medicine

## 2019-01-30 ENCOUNTER — Encounter: Payer: Self-pay | Admitting: Family Medicine

## 2019-06-08 ENCOUNTER — Other Ambulatory Visit: Payer: Self-pay

## 2019-06-08 ENCOUNTER — Ambulatory Visit (INDEPENDENT_AMBULATORY_CARE_PROVIDER_SITE_OTHER): Payer: 59 | Admitting: Family Medicine

## 2019-06-08 ENCOUNTER — Encounter: Payer: Self-pay | Admitting: Family Medicine

## 2019-06-08 VITALS — BP 115/78 | HR 83 | Temp 97.4°F | Resp 16 | Ht 65.5 in | Wt 293.0 lb

## 2019-06-08 DIAGNOSIS — Z0000 Encounter for general adult medical examination without abnormal findings: Secondary | ICD-10-CM

## 2019-06-08 DIAGNOSIS — E7800 Pure hypercholesterolemia, unspecified: Secondary | ICD-10-CM

## 2019-06-08 NOTE — Progress Notes (Signed)
Office Note 06/08/2019  CC:  Chief Complaint  Patient presents with  . Annual Exam    pt is not fasting    HPI:  Jodi [name] is a 58 y.o. White female who is here for annual health maintenance exam.  Walking in evenings some. Trying to limit portion sizes. Not fasting today.  Feeling fine, no acute complaints. Sees GYN annually.  Past Medical History:  Diagnosis Date  . Chronic constipation    does well on 2 OTC stool softeners per night  . Family history of BRCA2 gene positive   . Family history of breast cancer    Medical genetics eval 03/2017: genetic screening panel to better assess/estimate her future risk of developing cancer came back "negative"--indicating that she has NO increased risk and she should be screened for approp cancers as average risk pt.(as of 04/2017).  . GERD (gastroesophageal reflux disease)    w/ hiatal hernia.  EGD 4/172002 (Dr. Collene Mares) normal biopsy of esophagus  . Hemorrhoids 02/24/01   Colonoscopy; Dr. Collene Mares  . Hyperlipidemia    intolerant of simvastatin  . Menopausal symptom    Dr. Philis Pique has her on estradiol '1mg'$  qd as of 10/2016  . Morbid obesity ([place])   . Palpitations    ECHO 09/2010 normal.  Rarely has to take '10mg'$  propranolol prn palpitations.  . Plantar fasciitis 2017  . Rotator cuff tendonitis 09/02/2012  . Subclinical hypothyroidism 2017/18   TSH VERY mildly elevated.  . Vitamin B12 deficiency 2010   IM therapy at one time but switched to oral in early 2012 and levels ok    Past Surgical History:  Procedure Laterality Date  . ABDOMINAL HYSTERECTOMY  05/26/2006   (pathology benign) GYN no longer does any cerv ca scr.  . CHOLECYSTECTOMY  04/05/02   lap chole  . COLONOSCOPY  2002; 2014   Int/ext hemorr, o/w normal.  Normal repeat colonoscopy 03/16/13 by Dr. Collene Mares.  Recall 2024.  Marland Kitchen SALPINGOOPHORECTOMY  05/26/06   Right w/hysterectomy (pathology benign)  . SALPINGOOPHORECTOMY  12/09/04   Left (pathology benign)  . TONSILLECTOMY       Family History  Problem Relation Age of Onset  . Diabetes Mother   . Heart disease Mother        CABG  . Hyperlipidemia Mother   . Hypertension Mother   . Cancer Father        lung  . Alcohol abuse Father   . Melanoma Father 76       on his nose  . Heart disease Maternal Grandmother   . Hyperlipidemia Maternal Grandmother   . Hypertension Maternal Grandmother   . Stroke Maternal Grandmother   . Diabetes Maternal Grandmother   . Lung cancer Maternal Uncle   . Lung cancer Paternal Uncle   . Heart disease Maternal Grandfather   . Lung cancer Paternal Grandfather   . Pancreatic cancer Paternal Uncle        died in his 51s  . Breast cancer Cousin        died at 68  . BRCA 1/2 Cousin        BRCA2 pos  . Breast cancer Cousin 21       paternal first cousin's daughter  . BRCA 1/2 Cousin        BRCA2 pos    Social History   Socioeconomic History  . Marital status: Married    Spouse name: Not on file  . Number of children: Not on file  . Years  of education: Not on file  . Highest education level: Not on file  Occupational History  . Not on file  Social Needs  . Financial resource strain: Not on file  . Food insecurity    Worry: Not on file    Inability: Not on file  . Transportation needs    Medical: Not on file    Non-medical: Not on file  Tobacco Use  . Smoking status: Never Smoker  . Smokeless tobacco: Never Used  Substance and Sexual Activity  . Alcohol use: Yes    Comment: occassionally  . Drug use: Never  . Sexual activity: Yes    Birth control/protection: Surgical  Lifestyle  . Physical activity    Days per week: Not on file    Minutes per session: Not on file  . Stress: Not on file  Relationships  . Social Herbalist on phone: Not on file    Gets together: Not on file    Attends religious service: Not on file    Active member of club or organization: Not on file    Attends meetings of clubs or organizations: Not on file     Relationship status: Not on file  . Intimate partner violence    Fear of current or ex partner: Not on file    Emotionally abused: Not on file    Physically abused: Not on file    Forced sexual activity: Not on file  Other Topics Concern  . Not on file  Social History Narrative   Married, 2 children, 3 grandchildren.   Works as a Marine scientist for [employer], also part time in [facility] and [facility].   No T/A/Ds.  Walks 1-3 miles daily.    Outpatient Medications Prior to Visit  Medication Sig Dispense Refill  . atorvastatin (LIPITOR) 40 MG tablet TAKE 1 TABLET BY MOUTH  DAILY 90 tablet 1  . cyanocobalamin 500 MCG tablet Take 500 mcg by mouth daily.    Mariane Baumgarten Calcium (STOOL SOFTENER PO) Take 2 tablets by mouth at bedtime.     Marland Kitchen estradiol (ESTRACE) 1 MG tablet Take 1 tablet by mouth daily.    . Omega-3 Fatty Acids (FISH OIL) 1200 MG CAPS Take 2 capsules by mouth daily.     Marland Kitchen omeprazole (PRILOSEC) 40 MG capsule Take 1 capsule by mouth daily.     No facility-administered medications prior to visit.     Allergies  Allergen Reactions  . Codeine Other (See Comments)    Skin crawls    ROS Review of Systems  Constitutional: Negative for appetite change, chills, fatigue and fever.  HENT: Negative for congestion, dental problem, ear pain and sore throat.   Eyes: Negative for discharge, redness and visual disturbance.  Respiratory: Negative for cough, chest tightness, shortness of breath and wheezing.   Cardiovascular: Negative for chest pain, palpitations and leg swelling.  Gastrointestinal: Negative for abdominal pain, blood in stool, diarrhea, nausea and vomiting.  Genitourinary: Negative for difficulty urinating, dysuria, flank pain, frequency, hematuria and urgency.  Musculoskeletal: Negative for arthralgias, back pain, joint swelling, myalgias and neck stiffness.  Skin: Negative for pallor and rash.  Neurological: Negative for dizziness, speech difficulty, weakness and headaches.   Hematological: Negative for adenopathy. Does not bruise/bleed easily.  Psychiatric/Behavioral: Negative for confusion and sleep disturbance. The patient is not nervous/anxious.     PE; Blood pressure 115/78, pulse 83, temperature (!) 97.4 F (36.3 C), temperature source Temporal, resp. rate 16, height 5' 5.5" (  1.664 m), weight 293 lb (132.9 kg), last menstrual period 05/10/2006, SpO2 96 %. Body mass index is 48.02 kg/m. Exam chaperoned by Deveron Furlong, CMA.  Gen: Alert, well appearing.  Patient is oriented to person, place, time, and situation. AFFECT: pleasant, lucid thought and speech. ENT: Ears: EACs clear, normal epithelium.  TMs with good light reflex and landmarks bilaterally.  Eyes: no injection, icteris, swelling, or exudate.  EOMI, PERRLA. Nose: no drainage or turbinate edema/swelling.  No injection or focal lesion.  Mouth: lips without lesion/swelling.  Oral mucosa pink and moist.  Dentition intact and without obvious caries or gingival swelling.  Oropharynx without erythema, exudate, or swelling.  Neck: supple/nontender.  No LAD, mass, or TM.  Carotid pulses 2+ bilaterally, without bruits. CV: RRR, no m/r/g.   LUNGS: CTA bilat, nonlabored resps, good aeration in all lung fields. ABD: soft, NT, ND, BS normal.  No hepatospenomegaly or mass.  No bruits. EXT: no clubbing, cyanosis, or edema.  Musculoskeletal: no joint swelling, erythema, warmth, or tenderness.  ROM of all joints intact. Skin - no sores or suspicious lesions or rashes or color changes   Pertinent labs:  Lab Results  Component Value Date   TSH 3.26 05/27/2018   Lab Results  Component Value Date   WBC 6.6 05/27/2018   HGB 12.6 05/27/2018   HCT 38.1 05/27/2018   MCV 97.2 05/27/2018   PLT 249.0 05/27/2018   Lab Results  Component Value Date   CREATININE 0.82 05/27/2018   BUN 10 05/27/2018   NA 140 05/27/2018   K 4.7 05/27/2018   CL 104 05/27/2018   CO2 31 05/27/2018   Lab Results  Component Value  Date   ALT 22 05/27/2018   AST 16 05/27/2018   ALKPHOS 102 05/27/2018   BILITOT 0.4 05/27/2018   Lab Results  Component Value Date   CHOL 196 05/27/2018   Lab Results  Component Value Date   HDL 58.20 05/27/2018   Lab Results  Component Value Date   LDLCALC 93 05/25/2017   Lab Results  Component Value Date   TRIG 203.0 (H) 05/27/2018   Lab Results  Component Value Date   CHOLHDL 3 05/27/2018   No results found for: HGBA1C Lab Results  Component Value Date   VITAMINB12 826 05/22/2016    ASSESSMENT AND PLAN:   Health maintenance exam: Reviewed age and gender appropriate health maintenance issues (prudent diet, regular exercise, health risks of tobacco and excessive alcohol, use of seatbelts, fire alarms in home, use of sunscreen).  Also reviewed age and gender appropriate health screening as well as vaccine recommendations. Vaccines: all UTD. She declined shingrix. Labs: fasting HP ordered-future (pt not fasting today). Cervical ca screening: last pap was 11/2017.  She has hx of hysterectomy for benign dx in 2007.   Breast ca screening: last mammogram was 01/2019 via her GYN MD at [facility]. Colon ca screening: next colonoscopy due 2024.  An After Visit Summary was printed and given to the patient.  FOLLOW UP:  Return in about 1 year (around 06/07/2020) for annual CPE (fasting).  Signed:  Crissie Sickles, MD           06/08/2019

## 2019-06-08 NOTE — Patient Instructions (Signed)
Health Maintenance, Female Adopting a healthy lifestyle and getting preventive care are important in promoting health and wellness. Ask your health care provider about:  The right schedule for you to have regular tests and exams.  Things you can do on your own to prevent diseases and keep yourself healthy. What should I know about diet, weight, and exercise? Eat a healthy diet   Eat a diet that includes plenty of vegetables, fruits, low-fat dairy products, and lean protein.  Do not eat a lot of foods that are high in solid fats, added sugars, or sodium. Maintain a healthy weight Body mass index (BMI) is used to identify weight problems. It estimates body fat based on height and weight. Your health care provider can help determine your BMI and help you achieve or maintain a healthy weight. Get regular exercise Get regular exercise. This is one of the most important things you can do for your health. Most adults should:  Exercise for at least 150 minutes each week. The exercise should increase your heart rate and make you sweat (moderate-intensity exercise).  Do strengthening exercises at least twice a week. This is in addition to the moderate-intensity exercise.  Spend less time sitting. Even light physical activity can be beneficial. Watch cholesterol and blood lipids Have your blood tested for lipids and cholesterol at 57 years of age, then have this test every 5 years. Have your cholesterol levels checked more often if:  Your lipid or cholesterol levels are high.  You are older than 57 years of age.  You are at high risk for heart disease. What should I know about cancer screening? Depending on your health history and family history, you may need to have cancer screening at various ages. This may include screening for:  Breast cancer.  Cervical cancer.  Colorectal cancer.  Skin cancer.  Lung cancer. What should I know about heart disease, diabetes, and high blood  pressure? Blood pressure and heart disease  High blood pressure causes heart disease and increases the risk of stroke. This is more likely to develop in people who have high blood pressure readings, are of African descent, or are overweight.  Have your blood pressure checked: ? Every 3-5 years if you are 18-39 years of age. ? Every year if you are 40 years old or older. Diabetes Have regular diabetes screenings. This checks your fasting blood sugar level. Have the screening done:  Once every three years after age 40 if you are at a normal weight and have a low risk for diabetes.  More often and at a younger age if you are overweight or have a high risk for diabetes. What should I know about preventing infection? Hepatitis B If you have a higher risk for hepatitis B, you should be screened for this virus. Talk with your health care provider to find out if you are at risk for hepatitis B infection. Hepatitis C Testing is recommended for:  Everyone born from 1945 through 1965.  Anyone with known risk factors for hepatitis C. Sexually transmitted infections (STIs)  Get screened for STIs, including gonorrhea and chlamydia, if: ? You are sexually active and are younger than 57 years of age. ? You are older than 57 years of age and your health care provider tells you that you are at risk for this type of infection. ? Your sexual activity has changed since you were last screened, and you are at increased risk for chlamydia or gonorrhea. Ask your health care provider if   you are at risk.  Ask your health care provider about whether you are at high risk for HIV. Your health care provider may recommend a prescription medicine to help prevent HIV infection. If you choose to take medicine to prevent HIV, you should first get tested for HIV. You should then be tested every 3 months for as long as you are taking the medicine. Pregnancy  If you are about to stop having your period (premenopausal) and  you may become pregnant, seek counseling before you get pregnant.  Take 400 to 800 micrograms (mcg) of folic acid every day if you become pregnant.  Ask for birth control (contraception) if you want to prevent pregnancy. Osteoporosis and menopause Osteoporosis is a disease in which the bones lose minerals and strength with aging. This can result in bone fractures. If you are 65 years old or older, or if you are at risk for osteoporosis and fractures, ask your health care provider if you should:  Be screened for bone loss.  Take a calcium or vitamin D supplement to lower your risk of fractures.  Be given hormone replacement therapy (HRT) to treat symptoms of menopause. Follow these instructions at home: Lifestyle  Do not use any products that contain nicotine or tobacco, such as cigarettes, e-cigarettes, and chewing tobacco. If you need help quitting, ask your health care provider.  Do not use street drugs.  Do not share needles.  Ask your health care provider for help if you need support or information about quitting drugs. Alcohol use  Do not drink alcohol if: ? Your health care provider tells you not to drink. ? You are pregnant, may be pregnant, or are planning to become pregnant.  If you drink alcohol: ? Limit how much you use to 0-1 drink a day. ? Limit intake if you are breastfeeding.  Be aware of how much alcohol is in your drink. In the [place] one drink equals one 12 oz bottle of beer (355 mL), one 5 oz glass of wine (148 mL), or one 1 oz glass of hard liquor (44 mL). General instructions  Schedule regular health, dental, and eye exams.  Stay current with your vaccines.  Tell your health care provider if: ? You often feel depressed. ? You have ever been abused or do not feel safe at home. Summary  Adopting a healthy lifestyle and getting preventive care are important in promoting health and wellness.  Follow your health care provider's instructions about healthy  diet, exercising, and getting tested or screened for diseases.  Follow your health care provider's instructions on monitoring your cholesterol and blood pressure. This information is not intended to replace advice given to you by your health care provider. Make sure you discuss any questions you have with your health care provider. Document Released: 05/12/2011 Document Revised: 10/20/2018 Document Reviewed: 10/20/2018 Elsevier Patient Education  2020 [employer]  

## 2019-06-10 ENCOUNTER — Other Ambulatory Visit: Payer: Self-pay

## 2019-06-10 ENCOUNTER — Ambulatory Visit (INDEPENDENT_AMBULATORY_CARE_PROVIDER_SITE_OTHER): Payer: 59 | Admitting: Family Medicine

## 2019-06-10 DIAGNOSIS — E7800 Pure hypercholesterolemia, unspecified: Secondary | ICD-10-CM

## 2019-06-10 LAB — CBC WITH DIFFERENTIAL/PLATELET
Basophils Absolute: 0 10*3/uL (ref 0.0–0.1)
Basophils Relative: 0.3 % (ref 0.0–3.0)
Eosinophils Absolute: 0.1 10*3/uL (ref 0.0–0.7)
Eosinophils Relative: 1.5 % (ref 0.0–5.0)
HCT: 38.2 % (ref 36.0–46.0)
Hemoglobin: 12.5 g/dL (ref 12.0–15.0)
Lymphocytes Relative: 36.2 % (ref 12.0–46.0)
Lymphs Abs: 2.5 10*3/uL (ref 0.7–4.0)
MCHC: 32.8 g/dL (ref 30.0–36.0)
MCV: 96.5 fl (ref 78.0–100.0)
Monocytes Absolute: 0.4 10*3/uL (ref 0.1–1.0)
Monocytes Relative: 6.1 % (ref 3.0–12.0)
Neutro Abs: 3.8 10*3/uL (ref 1.4–7.7)
Neutrophils Relative %: 55.9 % (ref 43.0–77.0)
Platelets: 260 10*3/uL (ref 150.0–400.0)
RBC: 3.96 Mil/uL (ref 3.87–5.11)
RDW: 13.5 % (ref 11.5–15.5)
WBC: 6.8 10*3/uL (ref 4.0–10.5)

## 2019-06-10 LAB — COMPREHENSIVE METABOLIC PANEL
ALT: 17 U/L (ref 0–35)
AST: 15 U/L (ref 0–37)
Albumin: 3.8 g/dL (ref 3.5–5.2)
Alkaline Phosphatase: 103 U/L (ref 39–117)
BUN: 13 mg/dL (ref 6–23)
CO2: 28 mEq/L (ref 19–32)
Calcium: 9.2 mg/dL (ref 8.4–10.5)
Chloride: 103 mEq/L (ref 96–112)
Creatinine, Ser: 0.85 mg/dL (ref 0.40–1.20)
GFR: 68.98 mL/min (ref >60.00)
Glucose, Bld: 92 mg/dL (ref 70–99)
Potassium: 4.2 mEq/L (ref 3.5–5.1)
Sodium: 140 mEq/L (ref 135–145)
Total Bilirubin: 0.5 mg/dL (ref 0.2–1.2)
Total Protein: 6.5 g/dL (ref 6.0–8.3)

## 2019-06-10 LAB — LIPID PANEL
Cholesterol: 195 mg/dL (ref 0–200)
HDL: 55.8 mg/dL (ref >39.00)
NonHDL: 139.01
Total CHOL/HDL Ratio: 3
Triglycerides: 204 mg/dL — ABNORMAL HIGH (ref 0.0–149.0)
VLDL: 40.8 mg/dL — ABNORMAL HIGH (ref 0.0–40.0)

## 2019-06-10 LAB — TSH: TSH: 3.75 u[IU]/mL (ref 0.35–4.50)

## 2019-06-10 LAB — LDL CHOLESTEROL, DIRECT: Direct LDL: 113 mg/dL

## 2019-07-20 ENCOUNTER — Other Ambulatory Visit: Payer: Self-pay | Admitting: Family Medicine

## 2019-08-11 ENCOUNTER — Ambulatory Visit (INDEPENDENT_AMBULATORY_CARE_PROVIDER_SITE_OTHER): Payer: 59

## 2019-08-11 ENCOUNTER — Other Ambulatory Visit: Payer: Self-pay

## 2019-08-11 DIAGNOSIS — Z23 Encounter for immunization: Secondary | ICD-10-CM | POA: Diagnosis not present

## 2020-04-04 ENCOUNTER — Telehealth: Payer: Self-pay

## 2020-04-04 NOTE — Telephone Encounter (Signed)
Patient called to schedule appt. Patient states she has sinus congestion, , headache, denies fever.  I told patient she would have to make virtual appt.  She asked why. I told her we did not see patients with any covid symptoms.    She said I will have to find a new doctor then and hung up the phone.

## 2020-06-08 ENCOUNTER — Other Ambulatory Visit (INDEPENDENT_AMBULATORY_CARE_PROVIDER_SITE_OTHER): Payer: 59

## 2020-06-08 ENCOUNTER — Other Ambulatory Visit: Payer: Self-pay

## 2020-06-08 ENCOUNTER — Ambulatory Visit (INDEPENDENT_AMBULATORY_CARE_PROVIDER_SITE_OTHER): Payer: 59 | Admitting: Family Medicine

## 2020-06-08 ENCOUNTER — Encounter: Payer: Self-pay | Admitting: Family Medicine

## 2020-06-08 VITALS — BP 138/76 | HR 71 | Temp 98.3°F | Resp 16 | Ht 65.0 in | Wt 298.4 lb

## 2020-06-08 DIAGNOSIS — Z0000 Encounter for general adult medical examination without abnormal findings: Secondary | ICD-10-CM

## 2020-06-08 DIAGNOSIS — Z1231 Encounter for screening mammogram for malignant neoplasm of breast: Secondary | ICD-10-CM | POA: Diagnosis not present

## 2020-06-08 DIAGNOSIS — R7989 Other specified abnormal findings of blood chemistry: Secondary | ICD-10-CM

## 2020-06-08 DIAGNOSIS — E7800 Pure hypercholesterolemia, unspecified: Secondary | ICD-10-CM | POA: Diagnosis not present

## 2020-06-08 LAB — COMPREHENSIVE METABOLIC PANEL
ALT: 22 U/L (ref 0–35)
AST: 18 U/L (ref 0–37)
Albumin: 4 g/dL (ref 3.5–5.2)
Alkaline Phosphatase: 99 U/L (ref 39–117)
BUN: 17 mg/dL (ref 6–23)
CO2: 27 mEq/L (ref 19–32)
Calcium: 9.3 mg/dL (ref 8.4–10.5)
Chloride: 103 mEq/L (ref 96–112)
Creatinine, Ser: 0.86 mg/dL (ref 0.40–1.20)
GFR: 67.81 mL/min (ref >60.00)
Glucose, Bld: 93 mg/dL (ref 70–99)
Potassium: 4.2 mEq/L (ref 3.5–5.1)
Sodium: 139 mEq/L (ref 135–145)
Total Bilirubin: 0.4 mg/dL (ref 0.2–1.2)
Total Protein: 6.8 g/dL (ref 6.0–8.3)

## 2020-06-08 LAB — LIPID PANEL
Cholesterol: 207 mg/dL — ABNORMAL HIGH (ref 0–200)
HDL: 60 mg/dL (ref >39.00)
LDL Cholesterol: 110 mg/dL — ABNORMAL HIGH (ref 0–99)
NonHDL: 147.4
Total CHOL/HDL Ratio: 3
Triglycerides: 185 mg/dL — ABNORMAL HIGH (ref 0.0–149.0)
VLDL: 37 mg/dL (ref 0.0–40.0)

## 2020-06-08 LAB — CBC WITH DIFFERENTIAL/PLATELET
Basophils Absolute: 0 10*3/uL (ref 0.0–0.1)
Basophils Relative: 0.3 % (ref 0.0–3.0)
Eosinophils Absolute: 0.1 10*3/uL (ref 0.0–0.7)
Eosinophils Relative: 1.6 % (ref 0.0–5.0)
HCT: 38.7 % (ref 36.0–46.0)
Hemoglobin: 12.8 g/dL (ref 12.0–15.0)
Lymphocytes Relative: 35.6 % (ref 12.0–46.0)
Lymphs Abs: 2.7 10*3/uL (ref 0.7–4.0)
MCHC: 33 g/dL (ref 30.0–36.0)
MCV: 97.6 fl (ref 78.0–100.0)
Monocytes Absolute: 0.5 10*3/uL (ref 0.1–1.0)
Monocytes Relative: 6 % (ref 3.0–12.0)
Neutro Abs: 4.2 10*3/uL (ref 1.4–7.7)
Neutrophils Relative %: 56.5 % (ref 43.0–77.0)
Platelets: 250 10*3/uL (ref 150.0–400.0)
RBC: 3.96 Mil/uL (ref 3.87–5.11)
RDW: 13.8 % (ref 11.5–15.5)
WBC: 7.5 10*3/uL (ref 4.0–10.5)

## 2020-06-08 LAB — T4, FREE: Free T4: 0.68 ng/dL (ref 0.60–1.60)

## 2020-06-08 LAB — TSH: TSH: 4.9 u[IU]/mL — ABNORMAL HIGH (ref 0.35–4.50)

## 2020-06-08 LAB — T3, FREE: T3, Free: 3.2 pg/mL (ref 2.3–4.2)

## 2020-06-08 MED ORDER — ATORVASTATIN CALCIUM 40 MG PO TABS: 40.0000 mg | ORAL_TABLET | Freq: Every day | ORAL | 3 refills | Status: DC

## 2020-06-08 MED ORDER — ESTRADIOL 1 MG PO TABS: 1.0000 mg | ORAL_TABLET | Freq: Every day | ORAL | 3 refills | Status: DC

## 2020-06-08 NOTE — Progress Notes (Signed)
Office Note 06/08/2020  CC:  Chief Complaint  Patient presents with  . Annual Exam    pt is fasting   HPI:  Jodi [name] is a 59 y.o. White female who is here for annual health maintenance exam. 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  She has decided to stop following up with her GYN MD b/c too hard to get in for routine f/u. Asks me to take over rx of her estrace and ordering of her mammograms.  Past Medical History:  Diagnosis Date  . Chronic constipation    does well on 2 OTC stool softeners per night  . Family history of BRCA2 gene positive   . Family history of breast cancer    Medical genetics eval 03/2017: genetic screening panel to better assess/estimate her future risk of developing cancer came back "negative"--indicating that she has NO increased risk and she should be screened for approp cancers as average risk pt.(as of 04/2017).  . GERD (gastroesophageal reflux disease)    w/ hiatal hernia.  EGD 4/172002 (Dr. Collene Mares) normal biopsy of esophagus  . Hemorrhoids 02/24/01   Colonoscopy; Dr. Collene Mares  . Hyperlipidemia    intolerant of simvastatin  . Menopausal symptom    Dr. Philis Pique has her on estradiol 28m qd as of 10/2016  . Morbid [place] ([place]   . Palpitations    ECHO 09/2010 normal.  Rarely has to take 169mpropranolol prn palpitations.  . Plantar fasciitis 2017  . Rotator cuff tendonitis 09/02/2012  . Subclinical hypothyroidism 2017/18   TSH VERY mildly elevated.  . Vitamin B12 deficiency 2010   IM therapy at one time but switched to oral in early 2012 and levels ok    Past Surgical History:  Procedure Laterality Date  . ABDOMINAL HYSTERECTOMY  05/26/2006   (pathology benign) GYN no longer does any cerv ca scr.  . CHOLECYSTECTOMY  04/05/02   lap chole  . COLONOSCOPY  2002; 2014   Int/ext hemorr, o/w normal.  Normal repeat colonoscopy 03/16/13 by Dr. MaCollene Mares Recall 2024.  . Marland KitchenALPINGOOPHORECTOMY  05/26/06   Right w/hysterectomy (pathology benign)  .  SALPINGOOPHORECTOMY  12/09/04   Left (pathology benign)  . TONSILLECTOMY      Family History  Problem Relation Age of Onset  . Diabetes Mother   . Heart disease Mother        CABG  . Hyperlipidemia Mother   . Hypertension Mother   . Cancer Father        lung  . Alcohol abuse Father   . Melanoma Father [id number]     on his nose  . Heart disease Maternal Grandmother   . Hyperlipidemia Maternal Grandmother   . Hypertension Maternal Grandmother   . Stroke Maternal Grandmother   . Diabetes Maternal Grandmother   . Lung cancer Maternal Uncle   . Lung cancer Paternal Uncle   . Heart disease Maternal Grandfather   . Lung cancer Paternal Grandfather   . Pancreatic cancer Paternal Uncle        died in his 5058s. Breast cancer Cousin        died at 3552. BRCA 1/2 Cousin        BRCA2 pos  . Breast cancer Cousin 3559     paternal first cousin's daughter  . BRCA 1/2 Cousin        BRCA2 pos    Social History   Socioeconomic History  . Marital status: Married  Spouse name: Not on file  . Number of children: Not on file  . Years of education: Not on file  . Highest education level: Not on file  Occupational History  . Not on file  Tobacco Use  . Smoking status: Never Smoker  . Smokeless tobacco: Never Used  Vaping Use  . Vaping Use: Never used  Substance and Sexual Activity  . Alcohol use: Yes    Comment: occassionally  . Drug use: Never  . Sexual activity: Yes    Birth control/protection: Surgical  Other Topics Concern  . Not on file  Social History Narrative   Married, 2 children, 3 grandchildren.   Works as a Marine scientist for [employer], also part time in [employer] and [employer] [facility].   No T/A/Ds.  Walks 1-3 miles daily.   Social Determinants of Health   Financial Resource Strain:   . Difficulty of Paying Living Expenses:   Food Insecurity:   . Worried About Charity fundraiser in the Last Year:   . Arboriculturist in the Last Year:   Transportation Needs:   . Lexicographer (Medical):   Marland Kitchen Lack of Transportation (Non-Medical):   Physical Activity:   . Days of Exercise per Week:   . Minutes of Exercise per Session:   Stress:   . Feeling of Stress :   Social Connections:   . Frequency of Communication with Friends and Family:   . Frequency of Social Gatherings with Friends and Family:   . Attends Religious Services:   . Active Member of Clubs or Organizations:   . Attends Archivist Meetings:   Marland Kitchen Marital Status:   Intimate Partner Violence:   . Fear of Current or Ex-Partner:   . Emotionally Abused:   Marland Kitchen Physically Abused:   . Sexually Abused:     Outpatient Medications Prior to Visit  Medication Sig Dispense Refill  . cyanocobalamin 2000 MCG tablet Take 2,000 mcg by mouth daily.     Mariane Baumgarten Calcium (STOOL SOFTENER PO) Take 2 tablets by mouth at bedtime.     . Omega-3 Fatty Acids (FISH OIL) 1200 MG CAPS Take 2 capsules by mouth daily.     Marland Kitchen omeprazole (PRILOSEC) 40 MG capsule Take 1 capsule by mouth daily.    Marland Kitchen atorvastatin (LIPITOR) 40 MG tablet TAKE 1 TABLET BY MOUTH  DAILY 90 tablet 3  . estradiol (ESTRACE) 1 MG tablet Take 1 tablet by mouth daily.     No facility-administered medications prior to visit.    Allergies  Allergen Reactions  . Codeine Other (See Comments)    Skin crawls    ROS Review of Systems  Constitutional: Negative for appetite change, chills, fatigue and fever.  HENT: Negative for congestion, dental problem, ear pain and sore throat.   Eyes: Negative for discharge, redness and visual disturbance.  Respiratory: Negative for cough, chest tightness, shortness of breath and wheezing.   Cardiovascular: Negative for chest pain, palpitations and leg swelling.  Gastrointestinal: Negative for abdominal pain, blood in stool, diarrhea, nausea and vomiting.  Genitourinary: Negative for difficulty urinating, dysuria, flank pain, frequency, hematuria and urgency.  Musculoskeletal: Negative for arthralgias,  back pain, joint swelling, myalgias and neck stiffness.  Skin: Negative for pallor and rash.  Neurological: Negative for dizziness, speech difficulty, weakness and headaches.  Hematological: Negative for adenopathy. Does not bruise/bleed easily.  Psychiatric/Behavioral: Negative for confusion and sleep disturbance. The patient is not nervous/anxious.     PE; Vitals  with BMI 06/08/2020 06/08/2019 05/27/2018  Height _0  5' 5.5" 5' 5.5"  Weight 298 lbs 6 oz 293 lbs 287 lbs  BMI 88.28 48 00.34  Systolic 917 915 056  Diastolic 76 78 82  Pulse 71 83 62  O2 sat on RA today is 99% Exam chaperoned by Deveron Furlong, CMA.  Gen: Alert, well appearing.  Patient is oriented to person, place, time, and situation. AFFECT: pleasant, lucid thought and speech. ENT: Ears: EACs clear, normal epithelium.  TMs with good light reflex and landmarks bilaterally.  Eyes: no injection, icteris, swelling, or exudate.  EOMI, PERRLA. Nose: no drainage or turbinate edema/swelling.  No injection or focal lesion.  Mouth: lips without lesion/swelling.  Oral mucosa pink and moist.  Dentition intact and without obvious caries or gingival swelling.  Oropharynx without erythema, exudate, or swelling.  Neck: supple/nontender.  No LAD, mass, or TM.  Carotid pulses 2+ bilaterally, without bruits. CV: RRR, no m/r/g.   LUNGS: CTA bilat, nonlabored resps, good aeration in all lung fields. ABD: soft, NT, ND, BS normal.  No hepatospenomegaly or mass.  No bruits. EXT: no clubbing, cyanosis, or edema.  Musculoskeletal: no joint swelling, erythema, warmth, or tenderness.  ROM of all joints intact. Skin - no sores or suspicious lesions or rashes or color changes   Pertinent labs:  Lab Results  Component Value Date   TSH 3.75 06/10/2019   Lab Results  Component Value Date   WBC 6.8 06/10/2019   HGB 12.5 06/10/2019   HCT 38.2 06/10/2019   MCV 96.5 06/10/2019   PLT 260.0 06/10/2019   Lab Results  Component Value Date    CREATININE 0.85 06/10/2019   BUN 13 06/10/2019   NA 140 06/10/2019   K 4.2 06/10/2019   CL 103 06/10/2019   CO2 28 06/10/2019   Lab Results  Component Value Date   ALT 17 06/10/2019   AST 15 06/10/2019   ALKPHOS 103 06/10/2019   BILITOT 0.5 06/10/2019   Lab Results  Component Value Date   CHOL 195 06/10/2019   Lab Results  Component Value Date   HDL 55.80 06/10/2019   Lab Results  Component Value Date   LDLCALC 93 05/25/2017   Lab Results  Component Value Date   TRIG 204.0 (H) 06/10/2019   Lab Results  Component Value Date   CHOLHDL 3 06/10/2019   ASSESSMENT AND PLAN:   Health maintenance exam: Reviewed age and gender appropriate health maintenance issues (prudent diet, regular exercise, health risks of tobacco and excessive alcohol, use of seatbelts, fire alarms in home, use of sunscreen).  Also reviewed age and gender appropriate health screening as well as vaccine recommendations. Vaccines: Shingrix->declines.  Covid 19->UTD. Labs: fasting HP labs ordered. Cervical ca screening: hx of hysterectomy for benign dx, no further cerv ca screening indicated. Breast ca screening: per [facility]->01/2019.  Ordered mammogram today. Colon ca screening:  Recall 2024.  Postmenopausal syndrome: I'll go ahead and assume responsibility for rx'ing her estrace. RF'd this and atorvastatin x 1 yr today.  An After Visit Summary was printed and given to the patient.  FOLLOW UP:  Return in about 1 year (around 06/08/2021) for annual CPE (fasting).  Signed:  Crissie Sickles, MD           06/08/2020

## 2020-06-08 NOTE — Patient Instructions (Signed)
Health Maintenance, Female Adopting a healthy lifestyle and getting preventive care are important in promoting health and wellness. Ask your health care provider about:  The right schedule for you to have regular tests and exams.  Things you can do on your own to prevent diseases and keep yourself healthy. What should I know about diet, weight, and exercise? Eat a healthy diet   Eat a diet that includes plenty of vegetables, fruits, low-fat dairy products, and lean protein.  Do not eat a lot of foods that are high in solid fats, added sugars, or sodium. Maintain a healthy weight Body mass index (BMI) is used to identify weight problems. It estimates body fat based on height and weight. Your health care provider can help determine your BMI and help you achieve or maintain a healthy weight. Get regular exercise Get regular exercise. This is one of the most important things you can do for your health. Most adults should:  Exercise for at least 150 minutes each week. The exercise should increase your heart rate and make you sweat (moderate-intensity exercise).  Do strengthening exercises at least twice a week. This is in addition to the moderate-intensity exercise.  Spend less time sitting. Even light physical activity can be beneficial. Watch cholesterol and blood lipids Have your blood tested for lipids and cholesterol at 58 years of age, then have this test every 5 years. Have your cholesterol levels checked more often if:  Your lipid or cholesterol levels are high.  You are older than 58 years of age.  You are at high risk for heart disease. What should I know about cancer screening? Depending on your health history and family history, you may need to have cancer screening at various ages. This may include screening for:  Breast cancer.  Cervical cancer.  Colorectal cancer.  Skin cancer.  Lung cancer. What should I know about heart disease, diabetes, and high blood  pressure? Blood pressure and heart disease  High blood pressure causes heart disease and increases the risk of stroke. This is more likely to develop in people who have high blood pressure readings, are of African descent, or are overweight.  Have your blood pressure checked: ? Every 3-5 years if you are 18-39 years of age. ? Every year if you are 40 years old or older. Diabetes Have regular diabetes screenings. This checks your fasting blood sugar level. Have the screening done:  Once every three years after age 40 if you are at a normal weight and have a low risk for diabetes.  More often and at a younger age if you are overweight or have a high risk for diabetes. What should I know about preventing infection? Hepatitis B If you have a higher risk for hepatitis B, you should be screened for this virus. Talk with your health care provider to find out if you are at risk for hepatitis B infection. Hepatitis C Testing is recommended for:  Everyone born from 1945 through 1965.  Anyone with known risk factors for hepatitis C. Sexually transmitted infections (STIs)  Get screened for STIs, including gonorrhea and chlamydia, if: ? You are sexually active and are younger than 58 years of age. ? You are older than 58 years of age and your health care provider tells you that you are at risk for this type of infection. ? Your sexual activity has changed since you were last screened, and you are at increased risk for chlamydia or gonorrhea. Ask your health care provider if   you are at risk.  Ask your health care provider about whether you are at high risk for HIV. Your health care provider may recommend a prescription medicine to help prevent HIV infection. If you choose to take medicine to prevent HIV, you should first get tested for HIV. You should then be tested every 3 months for as long as you are taking the medicine. Pregnancy  If you are about to stop having your period (premenopausal) and  you may become pregnant, seek counseling before you get pregnant.  Take 400 to 800 micrograms (mcg) of folic acid every day if you become pregnant.  Ask for birth control (contraception) if you want to prevent pregnancy. Osteoporosis and menopause Osteoporosis is a disease in which the bones lose minerals and strength with aging. This can result in bone fractures. If you are 65 years old or older, or if you are at risk for osteoporosis and fractures, ask your health care provider if you should:  Be screened for bone loss.  Take a calcium or vitamin D supplement to lower your risk of fractures.  Be given hormone replacement therapy (HRT) to treat symptoms of menopause. Follow these instructions at home: Lifestyle  Do not use any products that contain nicotine or tobacco, such as cigarettes, e-cigarettes, and chewing tobacco. If you need help quitting, ask your health care provider.  Do not use street drugs.  Do not share needles.  Ask your health care provider for help if you need support or information about quitting drugs. Alcohol use  Do not drink alcohol if: ? Your health care provider tells you not to drink. ? You are pregnant, may be pregnant, or are planning to become pregnant.  If you drink alcohol: ? Limit how much you use to 0-1 drink a day. ? Limit intake if you are breastfeeding.  Be aware of how much alcohol is in your drink. In the [place] one drink equals one 12 oz bottle of beer (355 mL), one 5 oz glass of wine (148 mL), or one 1 oz glass of hard liquor (44 mL). General instructions  Schedule regular health, dental, and eye exams.  Stay current with your vaccines.  Tell your health care provider if: ? You often feel depressed. ? You have ever been abused or do not feel safe at home. Summary  Adopting a healthy lifestyle and getting preventive care are important in promoting health and wellness.  Follow your health care provider's instructions about healthy  diet, exercising, and getting tested or screened for diseases.  Follow your health care provider's instructions on monitoring your cholesterol and blood pressure. This information is not intended to replace advice given to you by your health care provider. Make sure you discuss any questions you have with your health care provider. Document Revised: 10/20/2018 Document Reviewed: 10/20/2018 Elsevier Patient Education  2020 Elsevier Inc.  

## 2020-06-19 ENCOUNTER — Ambulatory Visit
Admission: RE | Admit: 2020-06-19 | Discharge: 2020-06-19 | Disposition: A | Discharge status: Home / Self Care | Payer: 59 | Source: Ambulatory Visit | Attending: Family Medicine | Admitting: Family Medicine

## 2020-06-19 ENCOUNTER — Other Ambulatory Visit: Payer: Self-pay

## 2020-06-19 DIAGNOSIS — Z1231 Encounter for screening mammogram for malignant neoplasm of breast: Secondary | ICD-10-CM

## 2021-05-07 ENCOUNTER — Other Ambulatory Visit: Payer: Self-pay | Admitting: Family Medicine

## 2021-05-07 DIAGNOSIS — Z1231 Encounter for screening mammogram for malignant neoplasm of breast: Secondary | ICD-10-CM

## 2021-05-10 ENCOUNTER — Other Ambulatory Visit: Payer: Self-pay | Admitting: Family Medicine

## 2021-06-01 ENCOUNTER — Other Ambulatory Visit: Payer: Self-pay | Admitting: Family Medicine

## 2021-06-10 ENCOUNTER — Other Ambulatory Visit: Payer: Self-pay

## 2021-06-10 ENCOUNTER — Encounter: Payer: Self-pay | Admitting: Family Medicine

## 2021-06-10 ENCOUNTER — Ambulatory Visit (INDEPENDENT_AMBULATORY_CARE_PROVIDER_SITE_OTHER): Payer: 59 | Admitting: Family Medicine

## 2021-06-10 VITALS — BP 130/70 | HR 66 | Temp 97.3°F | Resp 16 | Ht 65.25 in | Wt 300.0 lb

## 2021-06-10 DIAGNOSIS — Z0000 Encounter for general adult medical examination without abnormal findings: Secondary | ICD-10-CM | POA: Diagnosis not present

## 2021-06-10 DIAGNOSIS — E038 Other specified hypothyroidism: Secondary | ICD-10-CM

## 2021-06-10 DIAGNOSIS — N951 Menopausal and female climacteric states: Secondary | ICD-10-CM

## 2021-06-10 DIAGNOSIS — M7989 Other specified soft tissue disorders: Secondary | ICD-10-CM | POA: Diagnosis not present

## 2021-06-10 DIAGNOSIS — E7800 Pure hypercholesterolemia, unspecified: Secondary | ICD-10-CM | POA: Diagnosis not present

## 2021-06-10 LAB — COMPREHENSIVE METABOLIC PANEL
ALT: 19 U/L (ref 0–35)
AST: 18 U/L (ref 0–37)
Albumin: 3.8 g/dL (ref 3.5–5.2)
Alkaline Phosphatase: 99 U/L (ref 39–117)
BUN: 14 mg/dL (ref 6–23)
CO2: 26 mEq/L (ref 19–32)
Calcium: 9 mg/dL (ref 8.4–10.5)
Chloride: 104 mEq/L (ref 96–112)
Creatinine, Ser: 0.88 mg/dL (ref 0.40–1.20)
GFR: 72.23 mL/min (ref >60.00)
Glucose, Bld: 90 mg/dL (ref 70–99)
Potassium: 4.3 mEq/L (ref 3.5–5.1)
Sodium: 139 mEq/L (ref 135–145)
Total Bilirubin: 0.4 mg/dL (ref 0.2–1.2)
Total Protein: 6.6 g/dL (ref 6.0–8.3)

## 2021-06-10 LAB — LIPID PANEL
Cholesterol: 188 mg/dL (ref 0–200)
HDL: 55.8 mg/dL (ref >39.00)
LDL Cholesterol: 107 mg/dL — ABNORMAL HIGH (ref 0–99)
NonHDL: 132.57
Total CHOL/HDL Ratio: 3
Triglycerides: 126 mg/dL (ref 0.0–149.0)
VLDL: 25.2 mg/dL (ref 0.0–40.0)

## 2021-06-10 LAB — CBC WITH DIFFERENTIAL/PLATELET
Basophils Absolute: 0 10*3/uL (ref 0.0–0.1)
Basophils Relative: 0.6 % (ref 0.0–3.0)
Eosinophils Absolute: 0.1 10*3/uL (ref 0.0–0.7)
Eosinophils Relative: 1.1 % (ref 0.0–5.0)
HCT: 36.9 % (ref 36.0–46.0)
Hemoglobin: 12.1 g/dL (ref 12.0–15.0)
Lymphocytes Relative: 36.6 % (ref 12.0–46.0)
Lymphs Abs: 2.3 10*3/uL (ref 0.7–4.0)
MCHC: 32.7 g/dL (ref 30.0–36.0)
MCV: 95.8 fl (ref 78.0–100.0)
Monocytes Absolute: 0.4 10*3/uL (ref 0.1–1.0)
Monocytes Relative: 7 % (ref 3.0–12.0)
Neutro Abs: 3.5 10*3/uL (ref 1.4–7.7)
Neutrophils Relative %: 54.7 % (ref 43.0–77.0)
Platelets: 228 10*3/uL (ref 150.0–400.0)
RBC: 3.86 Mil/uL — ABNORMAL LOW (ref 3.87–5.11)
RDW: 13.6 % (ref 11.5–15.5)
WBC: 6.4 10*3/uL (ref 4.0–10.5)

## 2021-06-10 LAB — TSH: TSH: 4.75 u[IU]/mL (ref 0.35–5.50)

## 2021-06-10 LAB — T4, FREE: Free T4: 0.69 ng/dL (ref 0.60–1.60)

## 2021-06-10 NOTE — Patient Instructions (Signed)
Health Maintenance, Female Adopting a healthy lifestyle and getting preventive care are important in promoting health and wellness. Ask your health care provider about: The right schedule for you to have regular tests and exams. Things you can do on your own to prevent diseases and keep yourself healthy. What should I know about diet, weight, and exercise? Eat a healthy diet  Eat a diet that includes plenty of vegetables, fruits, low-fat dairy products, and lean protein. Do not eat a lot of foods that are high in solid fats, added sugars, or sodium.  Maintain a healthy weight Body mass index (BMI) is used to identify weight problems. It estimates body fat based on height and weight. Your health care provider can help determineyour BMI and help you achieve or maintain a healthy weight. Get regular exercise Get regular exercise. This is one of the most important things you can do for your health. Most adults should: Exercise for at least 150 minutes each week. The exercise should increase your heart rate and make you sweat (moderate-intensity exercise). Do strengthening exercises at least twice a week. This is in addition to the moderate-intensity exercise. Spend less time sitting. Even light physical activity can be beneficial. Watch cholesterol and blood lipids Have your blood tested for lipids and cholesterol at 59 years of age, then havethis test every 5 years. Have your cholesterol levels checked more often if: Your lipid or cholesterol levels are high. You are older than 59 years of age. You are at high risk for heart disease. What should I know about cancer screening? Depending on your health history and family history, you may need to have cancer screening at various ages. This may include screening for: Breast cancer. Cervical cancer. Colorectal cancer. Skin cancer. Lung cancer. What should I know about heart disease, diabetes, and high blood pressure? Blood pressure and heart  disease High blood pressure causes heart disease and increases the risk of stroke. This is more likely to develop in people who have high blood pressure readings, are of African descent, or are overweight. Have your blood pressure checked: Every 3-5 years if you are 18-39 years of age. Every year if you are 40 years old or older. Diabetes Have regular diabetes screenings. This checks your fasting blood sugar level. Have the screening done: Once every three years after age 40 if you are at a normal weight and have a low risk for diabetes. More often and at a younger age if you are overweight or have a high risk for diabetes. What should I know about preventing infection? Hepatitis B If you have a higher risk for hepatitis B, you should be screened for this virus. Talk with your health care provider to find out if you are at risk forhepatitis B infection. Hepatitis C Testing is recommended for: Everyone born from 1945 through 1965. Anyone with known risk factors for hepatitis C. Sexually transmitted infections (STIs) Get screened for STIs, including gonorrhea and chlamydia, if: You are sexually active and are younger than 59 years of age. You are older than 59 years of age and your health care provider tells you that you are at risk for this type of infection. Your sexual activity has changed since you were last screened, and you are at increased risk for chlamydia or gonorrhea. Ask your health care provider if you are at risk. Ask your health care provider about whether you are at high risk for HIV. Your health care provider may recommend a prescription medicine to help   prevent HIV infection. If you choose to take medicine to prevent HIV, you should first get tested for HIV. You should then be tested every 3 months for as long as you are taking the medicine. Pregnancy If you are about to stop having your period (premenopausal) and you may become pregnant, seek counseling before you get  pregnant. Take 400 to 800 micrograms (mcg) of folic acid every day if you become pregnant. Ask for birth control (contraception) if you want to prevent pregnancy. Osteoporosis and menopause Osteoporosis is a disease in which the bones lose minerals and strength with aging. This can result in bone fractures. If you are 65 years old or older, or if you are at risk for osteoporosis and fractures, ask your health care provider if you should: Be screened for bone loss. Take a calcium or vitamin D supplement to lower your risk of fractures. Be given hormone replacement therapy (HRT) to treat symptoms of menopause. Follow these instructions at home: Lifestyle Do not use any products that contain nicotine or tobacco, such as cigarettes, e-cigarettes, and chewing tobacco. If you need help quitting, ask your health care provider. Do not use street drugs. Do not share needles. Ask your health care provider for help if you need support or information about quitting drugs. Alcohol use Do not drink alcohol if: Your health care provider tells you not to drink. You are pregnant, may be pregnant, or are planning to become pregnant. If you drink alcohol: Limit how much you use to 0-1 drink a day. Limit intake if you are breastfeeding. Be aware of how much alcohol is in your drink. In the [place] one drink equals one 12 oz bottle of beer (355 mL), one 5 oz glass of wine (148 mL), or one 1 oz glass of hard liquor (44 mL). General instructions Schedule regular health, dental, and eye exams. Stay current with your vaccines. Tell your health care provider if: You often feel depressed. You have ever been abused or do not feel safe at home. Summary Adopting a healthy lifestyle and getting preventive care are important in promoting health and wellness. Follow your health care provider's instructions about healthy diet, exercising, and getting tested or screened for diseases. Follow your health care provider's  instructions on monitoring your cholesterol and blood pressure. This information is not intended to replace advice given to you by your health care provider. Make sure you discuss any questions you have with your healthcare provider. Document Revised: 10/20/2018 Document Reviewed: 10/20/2018 Elsevier Patient Education  2022 Elsevier Inc.  

## 2021-06-10 NOTE — Progress Notes (Signed)
Office Note 06/10/2021  CC:  Chief Complaint  Patient presents with   Annual Exam    Pt is fasting. Scheduled mammogram for 8/22    HPI:  Jodi [name] is a 60 y.o. White female who is here for annual health maintenance exam and 1 yr f/u hypercholesterolemia, subclinical hypothyroidism, and postmenopausal syndrome (on HRT). A/P as of last visit: "Health maintenance exam: Reviewed age and gender appropriate health maintenance issues (prudent diet, regular exercise, health risks of tobacco and excessive alcohol, use of seatbelts, fire alarms in home, use of sunscreen).  Also reviewed age and gender appropriate health screening as well as vaccine recommendations. Vaccines: Shingrix->declines.  Covid 19->UTD. Labs: fasting [facility] labs ordered. Cervical ca screening: hx of hysterectomy for benign dx, no further cerv ca screening indicated. Breast ca screening: per [facility]->01/2019.  Ordered mammogram today. Colon ca screening:  Recall 2024.   Postmenopausal syndrome: I'll go ahead and assume responsibility for rx'ing her estrace. RF'd this and atorvastatin x 1 yr today."  INTERIM HX: Has chronic mild bilat LL swelling, L>R.  Lately, it has been a little worse, hot weather makes it worse and worse during week when sits all day, goes down some with sleep hs. No calf pain.  HLD: tolerating atorva 40 qd. Postmenopausal syndrome: well controlled with daily estrace.  Past Medical History:  Diagnosis Date   Chronic constipation    does well on 2 OTC stool softeners per night   Family history of BRCA2 gene positive    Family history of breast cancer    Medical genetics eval 03/2017: genetic screening panel to better assess/estimate her future risk of developing cancer came back "negative"--indicating that she has NO increased risk and she should be screened for approp cancers as average risk pt.(as of 04/2017).   GERD (gastroesophageal reflux disease)    w/ hiatal  hernia.  EGD 4/172002 (Dr. Collene Mares) normal biopsy of esophagus   Hemorrhoids 02/24/01   Colonoscopy; Dr. Collene Mares   Hyperlipidemia    intolerant of simvastatin   Menopausal symptom    Dr. Philis Pique has her on estradiol 8m qd as of 10/2016   Morbid obesity ([place]    Palpitations    ECHO 09/2010 normal.  Rarely has to take 132mpropranolol prn palpitations.   Plantar fasciitis 2017   Rotator cuff tendonitis 09/02/2012   Subclinical hypothyroidism 2017/18   TSH VERY mildly elevated.   Vitamin B12 deficiency 2010   IM therapy at one time but switched to oral in early 2012 and levels ok    Past Surgical History:  Procedure Laterality Date   ABDOMINAL HYSTERECTOMY  05/26/2006   (pathology benign) GYN no longer does any cerv ca scr.   CHOLECYSTECTOMY  04/05/02   lap chole   COLONOSCOPY  2002; 2014   Int/ext hemorr, o/w normal.  Normal repeat colonoscopy 03/16/13 by Dr. MaCollene Mares Recall 2024.   SALPINGOOPHORECTOMY  05/26/06   Right w/hysterectomy (pathology benign)   SALPINGOOPHORECTOMY  12/09/04   Left (pathology benign)   TONSILLECTOMY      Family History  Problem Relation Age of Onset   Diabetes Mother    Heart disease Mother        CABG   Hyperlipidemia Mother    Hypertension Mother    Cancer Father        lung   Alcohol abuse Father    Melanoma Father [id number]     on his nose   Heart disease Maternal Grandmother  Hyperlipidemia Maternal Grandmother    Hypertension Maternal Grandmother    Stroke Maternal Grandmother    Diabetes Maternal Grandmother    Lung cancer Maternal Uncle    Lung cancer Paternal Uncle    Heart disease Maternal Grandfather    Lung cancer Paternal Grandfather    Pancreatic cancer Paternal Uncle        died in his 41s   Breast cancer Cousin        died at 77   BRCA 1/2 Cousin        BRCA2 pos   Breast cancer Cousin 5       paternal first cousin's daughter   BRCA 1/2 Cousin        BRCA2 pos    Social History   Socioeconomic History   Marital status:  Married    Spouse name: Not on file   Number of children: Not on file   Years of education: Not on file   Highest education level: Not on file  Occupational History   Not on file  Tobacco Use   Smoking status: Never   Smokeless tobacco: Never  Vaping Use   Vaping Use: Never used  Substance and Sexual Activity   Alcohol use: Yes    Comment: occassionally   Drug use: Never   Sexual activity: Yes    Birth control/protection: Surgical  Other Topics Concern   Not on file  Social History Narrative   Married, 2 children, 3 grandchildren.   Works as a Marine scientist for [employer], also part time in [employer] and [employer] [facility].   No T/A/Ds.  Walks 1-3 miles daily.   Social Determinants of Health   Financial Resource Strain: Not on file  Food Insecurity: Not on file  Transportation Needs: Not on file  Physical Activity: Not on file  Stress: Not on file  Social Connections: Not on file  Intimate Partner Violence: Not on file    Outpatient Medications Prior to Visit  Medication Sig Dispense Refill   atorvastatin (LIPITOR) 40 MG tablet TAKE 1 TABLET BY MOUTH  DAILY 90 tablet 0   cyanocobalamin 2000 MCG tablet Take 2,000 mcg by mouth daily.      estradiol (ESTRACE) 1 MG tablet TAKE 1 TABLET BY MOUTH  DAILY 90 tablet 1   Omega-3 Fatty Acids (FISH OIL) 1200 MG CAPS Take 2 capsules by mouth daily.      omeprazole (PRILOSEC) 40 MG capsule Take 1 capsule by mouth daily.     Docusate Calcium (STOOL SOFTENER PO) Take 2 tablets by mouth at bedtime.  (Patient not taking: Reported on 06/10/2021)     No facility-administered medications prior to visit.    Allergies  Allergen Reactions   Codeine Other (See Comments)    Skin crawls   Review of Systems  Constitutional:  Negative for appetite change, chills, fatigue and fever.  HENT:  Negative for congestion, dental problem, ear pain and sore throat.   Eyes:  Negative for discharge, redness and visual disturbance.  Respiratory:  Negative for cough, chest  tightness, shortness of breath and wheezing.   Cardiovascular:  Positive for leg swelling (see hpi). Negative for chest pain and palpitations.  Gastrointestinal:  Negative for abdominal pain, blood in stool, diarrhea, nausea and vomiting.  Genitourinary:  Negative for difficulty urinating, dysuria, flank pain, frequency, hematuria and urgency.  Musculoskeletal:  Negative for arthralgias, back pain, joint swelling, myalgias and neck stiffness.  Skin:  Negative for pallor and rash.  Neurological:  Negative for  dizziness, speech difficulty, weakness and headaches.  Hematological:  Negative for adenopathy. Does not bruise/bleed easily.  Psychiatric/Behavioral:  Negative for confusion and sleep disturbance. The patient is not nervous/anxious.    PE; Vitals with BMI 06/10/2021 06/08/2020 06/08/2019  Height 5' 5.25" '5\' 5"'  5' 5.5"  Weight 300 lbs 298 lbs 6 oz 293 lbs  BMI 68.61 68.37 48  Systolic 290 211 155  Diastolic 70 76 78  Pulse 66 71 83   Exam chaperoned by Shepard General, CMA  Gen: Alert, well appearing.  Patient is oriented to person, place, time, and situation. AFFECT: pleasant, lucid thought and speech. ENT: Ears: EACs clear, normal epithelium.  TMs with good light reflex and landmarks bilaterally.  Eyes: no injection, icteris, swelling, or exudate.  EOMI, PERRLA. Nose: no drainage or turbinate edema/swelling.  No injection or focal lesion.  Mouth: lips without lesion/swelling.  Oral mucosa pink and moist.  Dentition intact and without obvious caries or gingival swelling.  Oropharynx without erythema, exudate, or swelling.  Neck: supple/nontender.  No LAD, mass, or TM.  Carotid pulses 2+ bilaterally, without bruits. CV: RRR, no m/r/g.   LUNGS: CTA bilat, nonlabored resps, good aeration in all lung fields. ABD: soft, NT, ND, BS normal.  No hepatospenomegaly or mass.  No bruits. EXT: no clubbing, cyanosis, or edema.  L ankle a bit larger than R on observation.    Musculoskeletal: no joint  swelling, erythema, warmth, or tenderness.  ROM of all joints intact. Skin - no sores or suspicious lesions or rashes or color changes  Pertinent labs:  Lab Results  Component Value Date   TSH 4.90 (H) 06/08/2020   Lab Results  Component Value Date   WBC 7.5 06/08/2020   HGB 12.8 06/08/2020   HCT 38.7 06/08/2020   MCV 97.6 06/08/2020   PLT 250.0 06/08/2020   Lab Results  Component Value Date   CREATININE 0.86 06/08/2020   BUN 17 06/08/2020   NA 139 06/08/2020   K 4.2 06/08/2020   CL 103 06/08/2020   CO2 27 06/08/2020   Lab Results  Component Value Date   ALT 22 06/08/2020   AST 18 06/08/2020   ALKPHOS 99 06/08/2020   BILITOT 0.4 06/08/2020   Lab Results  Component Value Date   CHOL 207 (H) 06/08/2020   Lab Results  Component Value Date   HDL 60.00 06/08/2020   Lab Results  Component Value Date   LDLCALC 110 (H) 06/08/2020   Lab Results  Component Value Date   TRIG 185.0 (H) 06/08/2020   Lab Results  Component Value Date   CHOLHDL 3 06/08/2020   ASSESSMENT AND PLAN:   1) Hypercholesterolemia:  tolerating statin. FLP and hepatic panel today.  2) Postmenopausal syndrome:  cont estrace.  3) Subclinial hypothyroidism:  hx of very mild TSH elevation. Thyroid panel today.  4) Bilat LL swelling: suspect mild venous insuff, L>R Discussed low Na intake and periodic elevation of legs, avoid prolonged sitting, minimize exposure to extreme heat.  5) Health maintenance exam: Reviewed age and gender appropriate health maintenance issues (prudent diet, regular exercise, health risks of tobacco and excessive alcohol, use of seatbelts, fire alarms in home, use of sunscreen).  Also reviewed age and gender appropriate health screening as well as vaccine recommendations. Vaccines: Shingrix declined.  Otherwise ALL UTD. Labs: cbc, cmet, thyroid panel, lipids. Cervical ca screening: pt is s/p TAH w/BSO--benign dx.  Further screening not necessary. Breast ca screening:  mammogram scheduled for 07/01/21. Colon ca screening:  recall 2024.  An After Visit Summary was printed and given to the patient.  FOLLOW UP:  Return in about 1 year (around 06/10/2022) for annual CPE (fasting).  Signed:  Crissie Sickles, MD           06/10/2021

## 2021-06-11 LAB — T3: T3, Total: 190 ng/dL — ABNORMAL HIGH (ref 76–181)

## 2021-07-01 ENCOUNTER — Other Ambulatory Visit: Payer: Self-pay | Admitting: Family Medicine

## 2021-07-01 ENCOUNTER — Other Ambulatory Visit: Payer: Self-pay

## 2021-07-01 ENCOUNTER — Ambulatory Visit
Admission: RE | Admit: 2021-07-01 | Discharge: 2021-07-01 | Disposition: A | Discharge status: Home / Self Care | Payer: 59 | Source: Ambulatory Visit | Attending: Family Medicine | Admitting: Family Medicine

## 2021-07-01 DIAGNOSIS — Z1231 Encounter for screening mammogram for malignant neoplasm of breast: Secondary | ICD-10-CM

## 2021-07-23 ENCOUNTER — Telehealth: Payer: Self-pay

## 2021-07-23 MED ORDER — NIRMATRELVIR/RITONAVIR (PAXLOVID)TABLET: 3.0000 | ORAL_TABLET | Freq: Two times a day (BID) | ORAL | 0 refills | Status: AC

## 2021-07-23 NOTE — Telephone Encounter (Signed)
Patient called in requesting a call back from someone, Jodi [name] tested positive on Saturday and is wanting to know if she is too late to get the antiviral medication.

## 2021-07-23 NOTE — Telephone Encounter (Signed)
Please advise if Rx can be sent

## 2021-07-23 NOTE — Telephone Encounter (Signed)
Spoke with pt regarding med recommendations, she voiced understanding.

## 2021-07-23 NOTE — Telephone Encounter (Signed)
(  Pt is within treatment window for oral antiviral. High risk criteria: BMI>40. GFR is 72)  Will rx paxlovid. Stop atorvastatin while taking paxlovid.

## 2021-07-30 ENCOUNTER — Other Ambulatory Visit: Payer: Self-pay | Admitting: Family Medicine

## 2021-08-01 ENCOUNTER — Telehealth: Payer: Self-pay

## 2021-08-01 MED ORDER — BENZONATATE 200 MG PO CAPS: 200.0000 mg | ORAL_CAPSULE | Freq: Three times a day (TID) | ORAL | 0 refills | Status: DC | PRN

## 2021-08-01 NOTE — Telephone Encounter (Signed)
Will eRx tessalon pearls to try.

## 2021-08-01 NOTE — Telephone Encounter (Signed)
Patient recovering from COVID x 2 weeks ago. She cannot get any relief from the cough that is still lingering.  She is up most of the night with cough.  Please advise. [phone]

## 2021-08-01 NOTE — Telephone Encounter (Signed)
Pt advised rx sent in, has tried using mucinex and used otc cough medicine.

## 2021-08-01 NOTE — Telephone Encounter (Signed)
Pt was given paxlovid for covid infection, recommended otc medication would be mucinex DM if she has not tried this.  Please review and advise

## 2021-08-13 ENCOUNTER — Telehealth: Payer: Self-pay | Admitting: Family Medicine

## 2021-08-13 NOTE — Telephone Encounter (Signed)
Next step is office visit in person

## 2021-08-13 NOTE — Telephone Encounter (Signed)
Spoke with pt and appt scheduled for next available.

## 2021-08-13 NOTE — Telephone Encounter (Signed)
Pt was last given rx for tessalon pearls 9/22.  Please review and advise

## 2021-08-13 NOTE — Telephone Encounter (Signed)
Pt is still having post covid cough and she cant get rid of it, she said its been since 07/20/21. She has taken stuff over the counter and the medication that Dr Milinda Cave gave her and its still not working. She would like a call back because she wants to know what the next step should be. Please advise. Call back [phone]

## 2021-08-14 ENCOUNTER — Encounter: Payer: Self-pay | Admitting: Family Medicine

## 2021-08-14 ENCOUNTER — Other Ambulatory Visit: Payer: Self-pay

## 2021-08-14 ENCOUNTER — Ambulatory Visit (INDEPENDENT_AMBULATORY_CARE_PROVIDER_SITE_OTHER): Payer: 59 | Admitting: Family Medicine

## 2021-08-14 VITALS — BP 124/70 | HR 82 | Temp 97.8°F | Ht 62.25 in | Wt 299.8 lb

## 2021-08-14 DIAGNOSIS — U071 COVID-19: Secondary | ICD-10-CM | POA: Diagnosis not present

## 2021-08-14 DIAGNOSIS — J180 Bronchopneumonia, unspecified organism: Secondary | ICD-10-CM

## 2021-08-14 MED ORDER — HYDROCODONE BIT-HOMATROP MBR 5-1.5 MG/5ML PO SOLN: ORAL | 0 refills | Status: DC

## 2021-08-14 MED ORDER — PREDNISONE 20 MG PO TABS: ORAL_TABLET | ORAL | 0 refills | Status: DC

## 2021-08-14 MED ORDER — DOXYCYCLINE HYCLATE 100 MG PO CAPS: 100.0000 mg | ORAL_CAPSULE | Freq: Two times a day (BID) | ORAL | 0 refills | Status: AC

## 2021-08-14 NOTE — Progress Notes (Signed)
OFFICE VISIT  08/14/2021  CC:  Chief Complaint  Patient presents with   Cough    X1 month; productive , dark green color with abscess odor. Ha taken 3 bottles of mucinex, completed tessalon pearls.    HPI:    Patient is a 60 y.o. female who presents for ongoing cough.  HPI: Started with covid+ resp illness about 4 wks ago. Fatigue, URI sx's, bad HA, nonproductive cough, loss of taste and smell.  No fever.  Now still with PND, greenish foul smelling mucous production.  HR staying in 80s whereas her usual is 60s at rest. Mucinex and tessalon not very helpful.   Past Medical History:  Diagnosis Date   Chronic constipation    does well on 2 OTC stool softeners per night   Family history of BRCA2 gene positive    Family history of breast cancer    Medical genetics eval 03/2017: genetic screening panel to better assess/estimate her future risk of developing cancer came back "negative"--indicating that she has NO increased risk and she should be screened for approp cancers as average risk pt.(as of 04/2017).   GERD (gastroesophageal reflux disease)    w/ hiatal hernia.  EGD 4/172002 (Dr. Collene Mares) normal biopsy of esophagus   Hemorrhoids 02/24/01   Colonoscopy; Dr. Collene Mares   Hyperlipidemia    intolerant of simvastatin   Menopausal symptom    Dr. Philis Pique has her on estradiol 19m qd as of 10/2016   [place]    Palpitations    ECHO 09/2010 normal.  Rarely has to take 135mpropranolol prn palpitations.   Plantar fasciitis 2017   Rotator cuff tendonitis 09/02/2012   Subclinical hypothyroidism 2017/18   TSH VERY mildly elevated.   Vitamin B12 deficiency 2010   IM therapy at one time but switched to oral in early 2012 and levels ok    Past Surgical History:  Procedure Laterality Date   ABDOMINAL HYSTERECTOMY  05/26/2006   (pathology benign) GYN no longer does any cerv ca scr.   CHOLECYSTECTOMY  04/05/02   lap chole   COLONOSCOPY  2002; 2014   Int/ext hemorr, o/w normal.   Normal repeat colonoscopy 03/16/13 by Dr. MaCollene Mares Recall 2024.   SALPINGOOPHORECTOMY  05/26/06   Right w/hysterectomy (pathology benign)   SALPINGOOPHORECTOMY  12/09/04   Left (pathology benign)   TONSILLECTOMY      Outpatient Medications Prior to Visit  Medication Sig Dispense Refill   atorvastatin (LIPITOR) 40 MG tablet TAKE 1 TABLET BY MOUTH  DAILY 90 tablet 1   cyanocobalamin 2000 MCG tablet Take 2,000 mcg by mouth daily.      estradiol (ESTRACE) 1 MG tablet TAKE 1 TABLET BY MOUTH  DAILY 90 tablet 1   Omega-3 Fatty Acids (FISH OIL) 1200 MG CAPS Take 2 capsules by mouth daily.      omeprazole (PRILOSEC) 40 MG capsule Take 1 capsule by mouth daily.     benzonatate (TESSALON) 200 MG capsule Take 1 capsule (200 mg total) by mouth 3 (three) times daily as needed for cough. (Patient not taking: Reported on 08/14/2021) 30 capsule 0   Docusate Calcium (STOOL SOFTENER PO) Take 2 tablets by mouth at bedtime.  (Patient not taking: No sig reported)     No facility-administered medications prior to visit.    Allergies  Allergen Reactions   Codeine Other (See Comments)    Skin crawls    ROS As per HPI  PE: Vitals with BMI 08/14/2021 06/10/2021 06/08/2020  Height 5'  2.25" 5' 5.25" 5' 5"  Weight 299 lbs 13 oz 300 lbs 298 lbs 6 oz  BMI 54.4 20.10 07.12  Systolic 197 588 325  Diastolic 70 70 76  Pulse 82 66 71  O2 sat 95% on RA today - Gen: Alert, well appearing.  Patient is oriented to person, place, time, and situation. AFFECT: pleasant, lucid thought and speech. QDI:YMEB: no injection, icteris, swelling, or exudate.  EOMI, PERRLA. Mouth: lips without lesion/swelling.  Oral mucosa pink and moist. Oropharynx without erythema, exudate, or swelling.  NECK: no LAD or tenderness. CV: RRR, no m/r/g.   LUNGS: CTA bilat, nonlabored resps.  Mild dec aeration diffusely on exhalation, has cough on forceful exhalation. EXT: no clubbing or cyanosis.  no edema.    LABS:    Chemistry      Component  Value Date/Time   NA 139 06/10/2021 1001   K 4.3 06/10/2021 1001   CL 104 06/10/2021 1001   CO2 26 06/10/2021 1001   BUN 14 06/10/2021 1001   CREATININE 0.88 06/10/2021 1001      Component Value Date/Time   CALCIUM 9.0 06/10/2021 1001   ALKPHOS 99 06/10/2021 1001   AST 18 06/10/2021 1001   ALT 19 06/10/2021 1001   BILITOT 0.4 06/10/2021 1001       IMPRESSION AND PLAN:  1) Acute bronchitis/bronchopneumonia, likely complication of recent covid infection. Plan is prednisone 72m qd x 5d, then 260mqd x 5d. Doxycycline 10035mid x 10d. Hycodan cough syrup, 1-2 tsp tid prn, #120m86mAn After Visit Summary was printed and given to the patient.  FOLLOW UP: Return if symptoms worsen or fail to improve.  Signed:  PhilCrissie Sickles           08/14/2021

## 2021-08-30 ENCOUNTER — Telehealth: Payer: Self-pay

## 2021-08-30 DIAGNOSIS — R051 Acute cough: Secondary | ICD-10-CM

## 2021-08-30 DIAGNOSIS — J180 Bronchopneumonia, unspecified organism: Secondary | ICD-10-CM

## 2021-08-30 MED ORDER — LEVOFLOXACIN 500 MG PO TABS: ORAL_TABLET | ORAL | 0 refills | Status: DC

## 2021-08-30 MED ORDER — PREDNISONE 10 MG PO TABS: ORAL_TABLET | ORAL | 0 refills | Status: DC

## 2021-08-30 NOTE — Telephone Encounter (Signed)
Patient visit with Dr. Milinda Cave on 10/5 for COVID.  She states she still has horrible cough, it is productive, lots of phlegm and congestion. She has finished meds.  Should she do another round of meds? Please advise.    [phone]

## 2021-08-30 NOTE — Telephone Encounter (Signed)
I'll send in a new antibiotic and more prednisone and cough syrup. I want her to get a chest x-ray as well: I'll order for [facility].

## 2021-08-30 NOTE — Telephone Encounter (Signed)
Please advise 

## 2021-08-30 NOTE — Telephone Encounter (Signed)
Spoke with pt regarding recommendations, voiced understanding  

## 2021-08-31 ENCOUNTER — Other Ambulatory Visit: Payer: Self-pay

## 2021-08-31 ENCOUNTER — Ambulatory Visit (HOSPITAL_BASED_OUTPATIENT_CLINIC_OR_DEPARTMENT_OTHER)
Admission: RE | Admit: 2021-08-31 | Discharge: 2021-08-31 | Disposition: A | Discharge status: Home / Self Care | Payer: 59 | Source: Ambulatory Visit | Attending: Family Medicine | Admitting: Family Medicine

## 2021-08-31 DIAGNOSIS — J180 Bronchopneumonia, unspecified organism: Secondary | ICD-10-CM | POA: Insufficient documentation

## 2021-08-31 DIAGNOSIS — R051 Acute cough: Secondary | ICD-10-CM | POA: Diagnosis present

## 2021-10-31 ENCOUNTER — Telehealth: Payer: Self-pay

## 2021-10-31 NOTE — Telephone Encounter (Signed)
Spoke with pt and she states she is taking Mucinex DM and will try to beat it herself, no further details given. Appt offered with another provider within [place], pt declined at this time.

## 2021-10-31 NOTE — Telephone Encounter (Signed)
[facility] Day - Client Client Site [facility] - Day Contact Type Call Who Is Calling Patient / Member / Family / Caregiver Caller Name [name] Caller Phone Number [phone] Patient Name Jodi [name] Patient DOB Jan 19, 2061 Call Type Message Only Information Provided Reason for Call Request to Schedule Office Appointment Initial Comment Caller states they need an appt today Disp. Time Disposition Final User 10/30/2021 7:58:54 AM General Information Provided Yes Providence Crosby Call Closed By: Providence Crosby Transaction Date/Time: 10/30/2021 7:56:52 AM (ET

## 2021-11-13 ENCOUNTER — Other Ambulatory Visit: Payer: Self-pay | Admitting: Family Medicine

## 2022-01-13 ENCOUNTER — Other Ambulatory Visit: Payer: Self-pay | Admitting: Family Medicine

## 2022-04-29 ENCOUNTER — Other Ambulatory Visit: Payer: Self-pay | Admitting: Family Medicine

## 2022-06-11 ENCOUNTER — Other Ambulatory Visit: Payer: Self-pay | Admitting: Family Medicine

## 2022-06-11 ENCOUNTER — Ambulatory Visit (INDEPENDENT_AMBULATORY_CARE_PROVIDER_SITE_OTHER): Payer: 59 | Admitting: Family Medicine

## 2022-06-11 ENCOUNTER — Encounter: Payer: Self-pay | Admitting: Family Medicine

## 2022-06-11 VITALS — BP 97/61 | HR 73 | Temp 97.6°F | Ht 65.5 in | Wt 302.0 lb

## 2022-06-11 DIAGNOSIS — Z0000 Encounter for general adult medical examination without abnormal findings: Secondary | ICD-10-CM

## 2022-06-11 DIAGNOSIS — Z1231 Encounter for screening mammogram for malignant neoplasm of breast: Secondary | ICD-10-CM

## 2022-06-11 DIAGNOSIS — E038 Other specified hypothyroidism: Secondary | ICD-10-CM

## 2022-06-11 DIAGNOSIS — Z23 Encounter for immunization: Secondary | ICD-10-CM

## 2022-06-11 DIAGNOSIS — E7800 Pure hypercholesterolemia, unspecified: Secondary | ICD-10-CM

## 2022-06-11 LAB — LIPID PANEL
Cholesterol: 193 mg/dL (ref 0–200)
HDL: 53.3 mg/dL (ref >39.00)
LDL Cholesterol: 113 mg/dL — ABNORMAL HIGH (ref 0–99)
NonHDL: 139.73
Total CHOL/HDL Ratio: 4
Triglycerides: 136 mg/dL (ref 0.0–149.0)
VLDL: 27.2 mg/dL (ref 0.0–40.0)

## 2022-06-11 LAB — COMPREHENSIVE METABOLIC PANEL
ALT: 19 U/L (ref 0–35)
AST: 16 U/L (ref 0–37)
Albumin: 3.9 g/dL (ref 3.5–5.2)
Alkaline Phosphatase: 102 U/L (ref 39–117)
BUN: 17 mg/dL (ref 6–23)
CO2: 27 mEq/L (ref 19–32)
Calcium: 9 mg/dL (ref 8.4–10.5)
Chloride: 103 mEq/L (ref 96–112)
Creatinine, Ser: 0.9 mg/dL (ref 0.40–1.20)
GFR: 69.81 mL/min (ref >60.00)
Glucose, Bld: 89 mg/dL (ref 70–99)
Potassium: 4.4 mEq/L (ref 3.5–5.1)
Sodium: 139 mEq/L (ref 135–145)
Total Bilirubin: 0.5 mg/dL (ref 0.2–1.2)
Total Protein: 6.5 g/dL (ref 6.0–8.3)

## 2022-06-11 LAB — CBC WITH DIFFERENTIAL/PLATELET
Basophils Absolute: 0 10*3/uL (ref 0.0–0.1)
Basophils Relative: 0.5 % (ref 0.0–3.0)
Eosinophils Absolute: 0.1 10*3/uL (ref 0.0–0.7)
Eosinophils Relative: 1.5 % (ref 0.0–5.0)
HCT: 38.5 % (ref 36.0–46.0)
Hemoglobin: 12.6 g/dL (ref 12.0–15.0)
Lymphocytes Relative: 37.8 % (ref 12.0–46.0)
Lymphs Abs: 2.6 10*3/uL (ref 0.7–4.0)
MCHC: 32.7 g/dL (ref 30.0–36.0)
MCV: 96.4 fl (ref 78.0–100.0)
Monocytes Absolute: 0.5 10*3/uL (ref 0.1–1.0)
Monocytes Relative: 7.2 % (ref 3.0–12.0)
Neutro Abs: 3.6 10*3/uL (ref 1.4–7.7)
Neutrophils Relative %: 53 % (ref 43.0–77.0)
Platelets: 240 10*3/uL (ref 150.0–400.0)
RBC: 3.99 Mil/uL (ref 3.87–5.11)
RDW: 13.9 % (ref 11.5–15.5)
WBC: 6.8 10*3/uL (ref 4.0–10.5)

## 2022-06-11 LAB — TSH: TSH: 4.23 u[IU]/mL (ref 0.35–5.50)

## 2022-06-11 LAB — T4, FREE: Free T4: 0.9 ng/dL (ref 0.60–1.60)

## 2022-06-11 NOTE — Progress Notes (Signed)
Office Note 06/11/2022  CC:  Chief Complaint  Patient presents with   Annual Exam    Pt is fasting    HPI:  Patient is a 61 y.o. female who is here for annual health maintenance exam and f/u HLD, subclin hypoth, chronic bilat LE swelling. A/P as of last visit: "1) Hypercholesterolemia:  tolerating statin. FLP and hepatic panel today.   2) Postmenopausal syndrome:  cont estrace.   3) Subclinial hypothyroidism:  hx of very mild TSH elevation. Thyroid panel today.   4) Bilat LL swelling: suspect mild venous insuff, L>R Discussed low Na intake and periodic elevation of legs, avoid prolonged sitting, minimize exposure to extreme heat.   5) Health maintenance exam: Reviewed age and gender appropriate health maintenance issues (prudent diet, regular exercise, health risks of tobacco and excessive alcohol, use of seatbelts, fire alarms in home, use of sunscreen).  Also reviewed age and gender appropriate health screening as well as vaccine recommendations. Vaccines: Shingrix declined.  Otherwise ALL UTD. Labs: cbc, cmet, thyroid panel, lipids. Cervical ca screening: pt is s/p TAH w/BSO--benign dx.  Further screening not necessary. Breast ca screening: mammogram scheduled for 07/01/21. Colon ca screening: recall 2024."  HPI Jodi [name] is feeling well.  She and her husband walk regularly at [facility] for exercise.    Past Medical History:  Diagnosis Date   Chronic constipation    does well on 2 OTC stool softeners per night   Family history of BRCA2 gene positive    Family history of breast cancer    Medical genetics eval 03/2017: genetic screening panel to better assess/estimate her future risk of developing cancer came back "negative"--indicating that she has NO increased risk and she should be screened for approp cancers as average risk pt.(as of 04/2017).   GERD (gastroesophageal reflux disease)    w/ hiatal hernia.  EGD 4/172002 (Dr. Collene Mares) normal biopsy of esophagus    Hemorrhoids 02/24/01   Colonoscopy; Dr. Collene Mares   Hyperlipidemia    intolerant of simvastatin   Menopausal symptom    Dr. Philis Pique has her on estradiol 17m qd as of 10/2016   Morbid obesity ([place]    Palpitations    ECHO 09/2010 normal.  Rarely has to take 173mpropranolol prn palpitations.   Plantar fasciitis 2017   Rotator cuff tendonitis 09/02/2012   Subclinical hypothyroidism 2017/18   TSH VERY mildly elevated.   Vitamin B12 deficiency 2010   IM therapy at one time but switched to oral in early 2012 and levels ok    Past Surgical History:  Procedure Laterality Date   ABDOMINAL HYSTERECTOMY  05/26/2006   (pathology benign) GYN no longer does any cerv ca scr.   CHOLECYSTECTOMY  04/05/02   lap chole   COLONOSCOPY  2002; 2014   Int/ext hemorr, o/w normal.  Normal repeat colonoscopy 03/16/13 by Dr. MaCollene Mares Recall 2024.   SALPINGOOPHORECTOMY  05/26/06   Right w/hysterectomy (pathology benign)   SALPINGOOPHORECTOMY  12/09/04   Left (pathology benign)   TONSILLECTOMY      Family History  Problem Relation Age of Onset   Diabetes Mother    Heart disease Mother        CABG   Hyperlipidemia Mother    Hypertension Mother    Cancer Father        lung   Alcohol abuse Father    Melanoma Father [id number]     on his nose   Heart disease Maternal Grandmother    Hyperlipidemia Maternal  Grandmother    Hypertension Maternal Grandmother    Stroke Maternal Grandmother    Diabetes Maternal Grandmother    Lung cancer Maternal Uncle    Lung cancer Paternal Uncle    Heart disease Maternal Grandfather    Lung cancer Paternal Grandfather    Pancreatic cancer Paternal Uncle        died in his 51s   Breast cancer Cousin        died at 32   BRCA 1/2 Cousin        BRCA2 pos   Breast cancer Cousin 25       paternal first cousin's daughter   BRCA 1/2 Cousin        BRCA2 pos    Social History   Socioeconomic History   Marital status: Married    Spouse name: Not on file   Number of children: Not  on file   Years of education: Not on file   Highest education level: Not on file  Occupational History   Not on file  Tobacco Use   Smoking status: Never   Smokeless tobacco: Never  Vaping Use   Vaping Use: Never used  Substance and Sexual Activity   Alcohol use: Yes    Comment: occassionally   Drug use: Never   Sexual activity: Yes    Birth control/protection: Surgical  Other Topics Concern   Not on file  Social History Narrative   Married, 2 children, 3 grandchildren.   Works as a Marine scientist for [employer], also part time in [employer] and [facility] ED.   No T/A/Ds.  Walks 1-3 miles daily.   Social Determinants of Health   Financial Resource Strain: Not on file  Food Insecurity: Not on file  Transportation Needs: Not on file  Physical Activity: Not on file  Stress: Not on file  Social Connections: Not on file  Intimate Partner Violence: Not on file    Outpatient Medications Prior to Visit  Medication Sig Dispense Refill   atorvastatin (LIPITOR) 40 MG tablet TAKE 1 TABLET BY MOUTH  DAILY 90 tablet 1   cyanocobalamin 2000 MCG tablet Take 2,000 mcg by mouth daily.      estradiol (ESTRACE) 1 MG tablet TAKE 1 TABLET BY MOUTH DAILY 90 tablet 0   Omega-3 Fatty Acids (FISH OIL) 1200 MG CAPS Take 2 capsules by mouth daily.      omeprazole (PRILOSEC) 40 MG capsule Take 1 capsule by mouth daily.     HYDROcodone bit-homatropine (HYCODAN) 5-1.5 MG/5ML syrup 1-2 tsp po tid prn cough 120 mL 0   levofloxacin (LEVAQUIN) 500 MG tablet 1 tab po qd x 7d 7 tablet 0   predniSONE (DELTASONE) 10 MG tablet 3 tabs po qd x 3d, then 2 tabs po qd x 3d, then 1 tab po qd x 3d 18 tablet 0   No facility-administered medications prior to visit.    Allergies  Allergen Reactions   Codeine Other (See Comments)    Skin crawls    ROS Review of Systems  Constitutional:  Negative for appetite change, chills, fatigue and fever.  HENT:  Negative for congestion, dental problem, ear pain and sore throat.   Eyes:   Negative for discharge, redness and visual disturbance.  Respiratory:  Negative for cough, chest tightness, shortness of breath and wheezing.   Cardiovascular:  Negative for chest pain, palpitations and leg swelling.  Gastrointestinal:  Negative for abdominal pain, blood in stool, diarrhea, nausea and vomiting.  Genitourinary:  Negative for difficulty  urinating, dysuria, flank pain, frequency, hematuria and urgency.  Musculoskeletal:  Negative for arthralgias, back pain, joint swelling, myalgias and neck stiffness.  Skin:  Negative for pallor and rash.  Neurological:  Negative for dizziness, speech difficulty, weakness and headaches.  Hematological:  Negative for adenopathy. Does not bruise/bleed easily.  Psychiatric/Behavioral:  Negative for confusion and sleep disturbance. The patient is not nervous/anxious.   OCCASIONAL HOT FLASH LATELY  PE;    06/11/2022    8:01 AM 08/14/2021    4:02 PM 06/10/2021    9:33 AM  Vitals with BMI  Height 5' 5.5" 5' 2.25" 5' 5.25"  Weight 302 lbs 299 lbs 13 oz 300 lbs  BMI 49.47 81.1 91.47  Systolic 97 829 562  Diastolic 61 70 70  Pulse 73 82 66    Exam chaperoned by Deveron Furlong, CMA. Gen: Alert, well appearing.  Patient is oriented to person, place, time, and situation. AFFECT: pleasant, lucid thought and speech. ENT: Ears: EACs clear, normal epithelium.  TMs with good light reflex and landmarks bilaterally.  Eyes: no injection, icteris, swelling, or exudate.  EOMI, PERRLA. Nose: no drainage or turbinate edema/swelling.  No injection or focal lesion.  Mouth: lips without lesion/swelling.  Oral mucosa pink and moist.  Dentition intact and without obvious caries or gingival swelling.  Oropharynx without erythema, exudate, or swelling.  Neck: supple/nontender.  No LAD, mass, or TM.  Carotid pulses 2+ bilaterally, without bruits. CV: RRR, no m/r/g.   LUNGS: CTA bilat, nonlabored resps, good aeration in all lung fields. ABD: soft, NT, ND, BS normal.  No  hepatospenomegaly or mass.  No bruits. EXT: no clubbing, cyanosis, or edema.  Musculoskeletal: no joint swelling, erythema, warmth, or tenderness.  ROM of all joints intact. Skin - no sores or suspicious lesions or rashes or color changes  Pertinent labs:  Lab Results  Component Value Date   TSH 4.75 06/10/2021   Lab Results  Component Value Date   WBC 6.4 06/10/2021   HGB 12.1 06/10/2021   HCT 36.9 06/10/2021   MCV 95.8 06/10/2021   PLT 228.0 06/10/2021   Lab Results  Component Value Date   CREATININE 0.88 06/10/2021   BUN 14 06/10/2021   NA 139 06/10/2021   K 4.3 06/10/2021   CL 104 06/10/2021   CO2 26 06/10/2021   Lab Results  Component Value Date   ALT 19 06/10/2021   AST 18 06/10/2021   ALKPHOS 99 06/10/2021   BILITOT 0.4 06/10/2021   Lab Results  Component Value Date   CHOL 188 06/10/2021   Lab Results  Component Value Date   HDL 55.80 06/10/2021   Lab Results  Component Value Date   LDLCALC 107 (H) 06/10/2021   Lab Results  Component Value Date   TRIG 126.0 06/10/2021   Lab Results  Component Value Date   CHOLHDL 3 06/10/2021   ASSESSMENT AND PLAN:   1) HLD, tolerating the atorvastatin 40 mg daily. LDL goal around 100. Lipid panel and hepatic panel today.  #2 Subclinial hypothyroidism:  hx of very mild TSH elevation. Thyroid panel today.  #3  Bilat LL swelling: suspect mild venous insuff, L>R.  No pitting edema today. Discussed low Na intake and periodic elevation of legs, avoid prolonged sitting, minimize exposure to extreme heat.  #4 health maintenance exam: Reviewed age and gender appropriate health maintenance issues (prudent diet, regular exercise, health risks of tobacco and excessive alcohol, use of seatbelts, fire alarms in home, use of sunscreen).  Also  reviewed age and gender appropriate health screening as well as vaccine recommendations. Vaccines: TdaP->given today.   Shingrix declined.  Otherwise ALL UTD. Labs: cbc, cmet,  thyroid panel, lipids. Cervical ca screening: pt is s/p TAH w/BSO--benign dx.  Further screening not necessary. Breast ca screening: mammogram NEG 07/01/21->pt needs to schedule. Colon ca screening: recall 2024.  An After Visit Summary was printed and given to the patient.  FOLLOW UP:  Return in about 6 months (around 12/12/2022).  Signed:  Crissie Sickles, MD           06/11/2022

## 2022-06-11 NOTE — Patient Instructions (Signed)

## 2022-06-12 LAB — T3: T3, Total: 167 ng/dL (ref 76–181)

## 2022-07-02 ENCOUNTER — Ambulatory Visit
Admission: RE | Admit: 2022-07-02 | Discharge: 2022-07-02 | Disposition: A | Discharge status: Home / Self Care | Payer: 59 | Source: Ambulatory Visit | Attending: Family Medicine | Admitting: Family Medicine

## 2022-07-02 DIAGNOSIS — Z1231 Encounter for screening mammogram for malignant neoplasm of breast: Secondary | ICD-10-CM

## 2022-07-05 ENCOUNTER — Other Ambulatory Visit: Payer: Self-pay | Admitting: Family Medicine

## 2022-07-23 ENCOUNTER — Other Ambulatory Visit: Payer: Self-pay | Admitting: Family Medicine

## 2022-12-12 ENCOUNTER — Ambulatory Visit (INDEPENDENT_AMBULATORY_CARE_PROVIDER_SITE_OTHER): Payer: 59 | Admitting: Family Medicine

## 2022-12-12 ENCOUNTER — Encounter: Payer: Self-pay | Admitting: Family Medicine

## 2022-12-12 VITALS — BP 102/69 | HR 75 | Temp 98.3°F | Ht 65.5 in | Wt 299.0 lb

## 2022-12-12 DIAGNOSIS — E7800 Pure hypercholesterolemia, unspecified: Secondary | ICD-10-CM | POA: Diagnosis not present

## 2022-12-12 DIAGNOSIS — Z23 Encounter for immunization: Secondary | ICD-10-CM | POA: Diagnosis not present

## 2022-12-12 DIAGNOSIS — E038 Other specified hypothyroidism: Secondary | ICD-10-CM | POA: Diagnosis not present

## 2022-12-12 LAB — TSH: TSH: 5.23 u[IU]/mL (ref 0.35–5.50)

## 2022-12-12 LAB — COMPREHENSIVE METABOLIC PANEL
ALT: 16 U/L (ref 0–35)
AST: 14 U/L (ref 0–37)
Albumin: 3.8 g/dL (ref 3.5–5.2)
Alkaline Phosphatase: 92 U/L (ref 39–117)
BUN: 13 mg/dL (ref 6–23)
CO2: 28 mEq/L (ref 19–32)
Calcium: 8.9 mg/dL (ref 8.4–10.5)
Chloride: 104 mEq/L (ref 96–112)
Creatinine, Ser: 0.8 mg/dL (ref 0.40–1.20)
GFR: 80.13 mL/min (ref >60.00)
Glucose, Bld: 95 mg/dL (ref 70–99)
Potassium: 4.3 mEq/L (ref 3.5–5.1)
Sodium: 140 mEq/L (ref 135–145)
Total Bilirubin: 0.3 mg/dL (ref 0.2–1.2)
Total Protein: 6.3 g/dL (ref 6.0–8.3)

## 2022-12-12 LAB — LIPID PANEL
Cholesterol: 197 mg/dL (ref 0–200)
HDL: 60.6 mg/dL (ref >39.00)
LDL Cholesterol: 107 mg/dL — ABNORMAL HIGH (ref 0–99)
NonHDL: 136.79
Total CHOL/HDL Ratio: 3
Triglycerides: 150 mg/dL — ABNORMAL HIGH (ref 0.0–149.0)
VLDL: 30 mg/dL (ref 0.0–40.0)

## 2022-12-12 LAB — T4, FREE: Free T4: 0.69 ng/dL (ref 0.60–1.60)

## 2022-12-12 IMAGING — DX DG CHEST 2V
2 series · 2 of 2 positions shown · non-contrast
Comparison: July 19, 2012

CLINICAL DATA: History of COVID with cough.

EXAM:
CHEST - 2 VIEW

[chest pa]
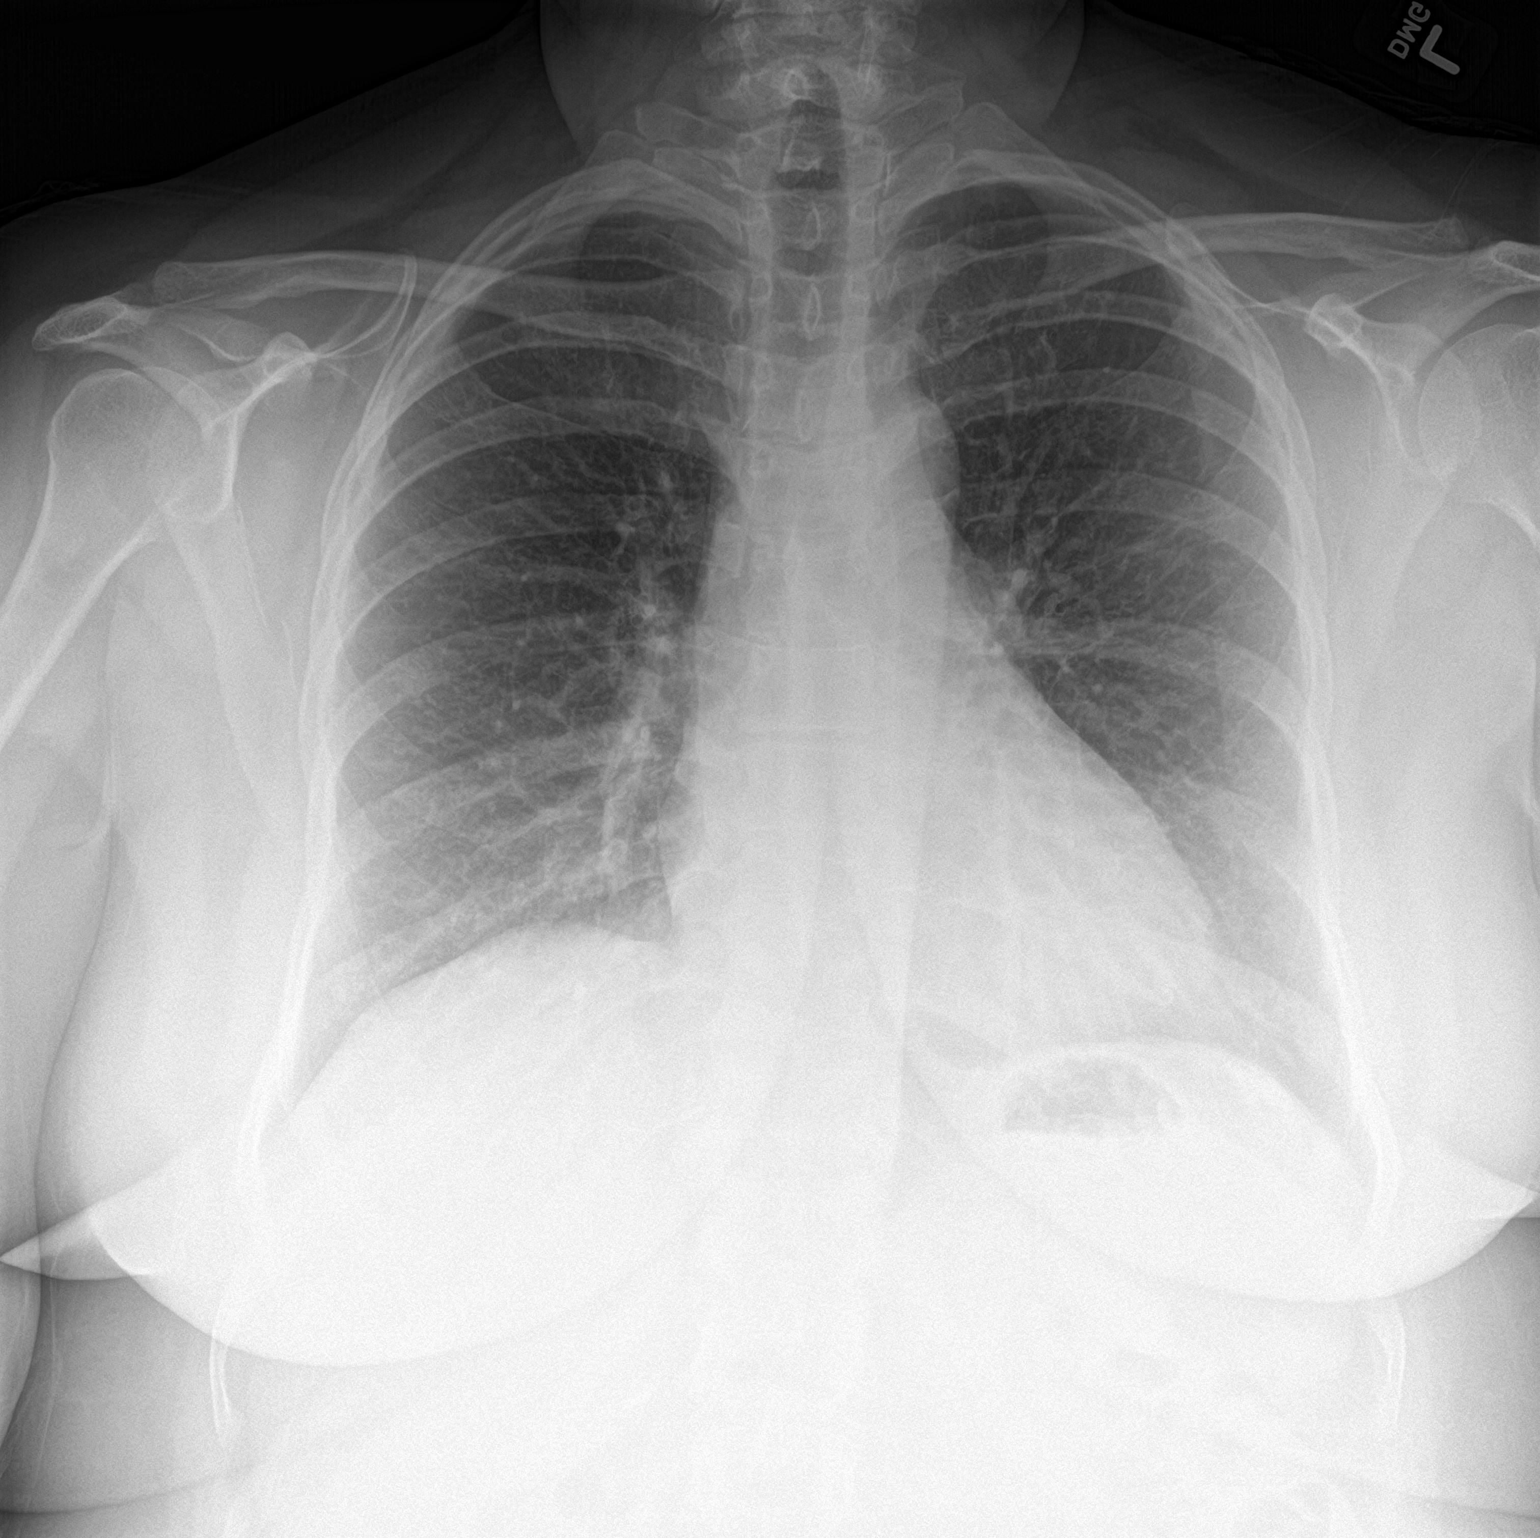

[chest lat]
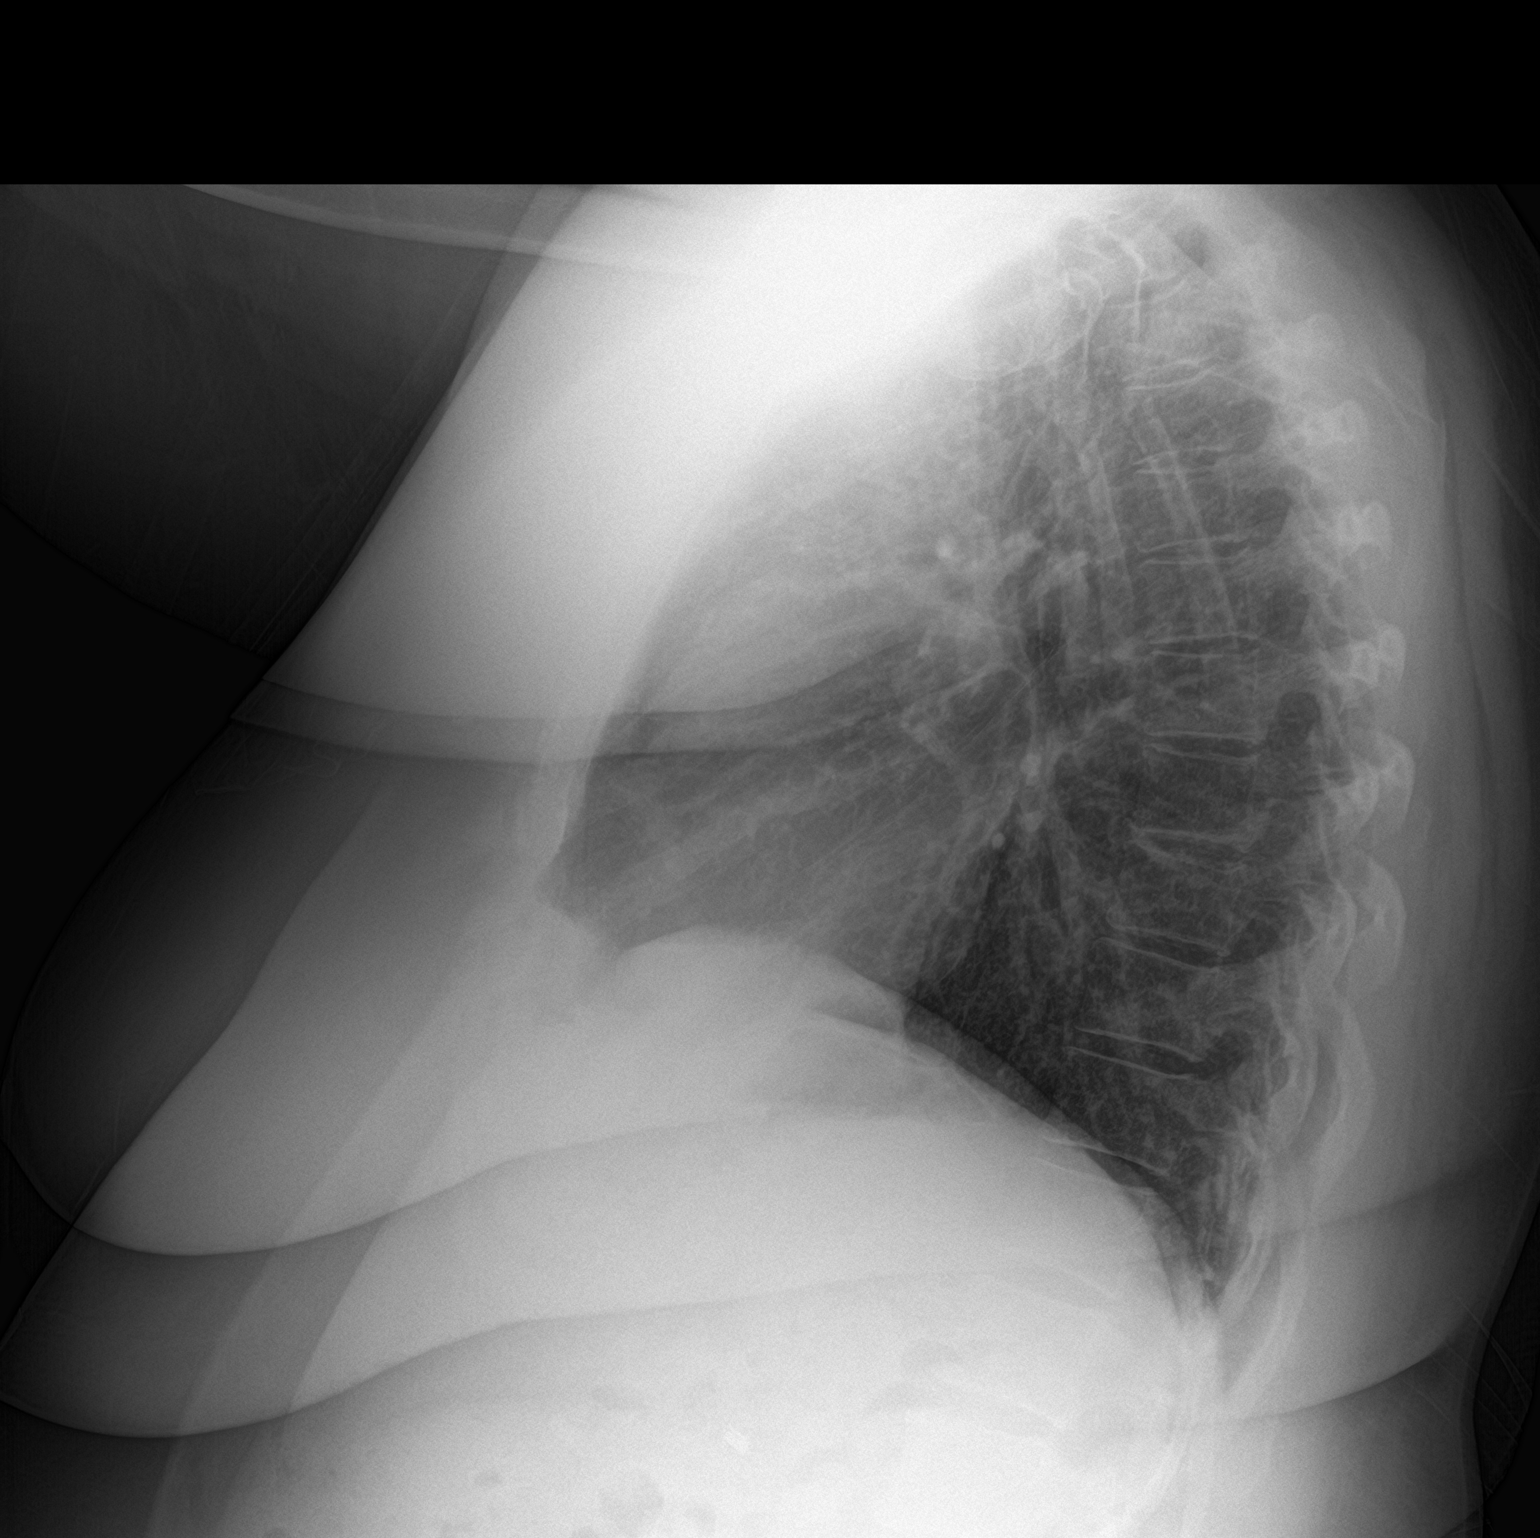

[2 of 2 positions shown; findings below may reference images not displayed]

FINDINGS: The heart size and mediastinal contours are within normal limits.
Both lungs are clear. Radiopaque surgical clips are seen within the
right upper quadrant. The visualized skeletal structures are
unremarkable.
IMPRESSION: No active cardiopulmonary disease.

## 2022-12-12 NOTE — Progress Notes (Signed)
OFFICE VISIT  12/12/2022  CC:  Chief Complaint  Patient presents with   Hyperlipidemia    F/u; pt is fasting     Patient is a 62 y.o. female who presents for 83-month follow-up hyperlipidemia and subclinical hypothyroidism.  INTERIM HX: Jodi [name] had a URI about 6 weeks ago, symptoms resolved gradually and she still just has a little bit of a hacking cough.  Also since I last saw her she went on vacation and when she got back she had acute right shoulder pain.  She went to [facility] and was given an injection for bursitis.  It did not improve so she got an MRI and it confirmed some small intrasubstance tears and bursitis per her report today.  No surgery was required.  She went to PT and this helped resolve it completely.  ROS as above, plus--> hair is falling out a bit more lately--not in clumps, just sparse.  No fevers, no CP, no SOB, no wheezing,  no dizziness, no HAs, no rashes, no melena/hematochezia.  No polyuria or polydipsia.  No myalgias or arthralgias.  No focal weakness, paresthesias, or tremors.  No acute vision or hearing abnormalities.  No dysuria or unusual/new urinary urgency or frequency.  No recent changes in lower legs. No n/v/d or abd pain.  No palpitations.    Past Medical History:  Diagnosis Date   Chronic constipation    does well on 2 OTC stool softeners per night   Family history of BRCA2 gene positive    Family history of breast cancer    Medical genetics eval 03/2017: genetic screening panel to better assess/estimate her future risk of developing cancer came back "negative"--indicating that she has NO increased risk and she should be screened for approp cancers as average risk pt.(as of 04/2017).   GERD (gastroesophageal reflux disease)    w/ hiatal hernia.  EGD 4/172002 (Dr. Collene Mares) normal biopsy of esophagus   Hemorrhoids 02/24/01   Colonoscopy; Dr. Collene Mares   Hyperlipidemia    intolerant of simvastatin   Menopausal symptom    Dr. Philis Pique has her on estradiol 1mg  qd  as of 10/2016   Morbid obesity ([place])    Palpitations    ECHO 09/2010 normal.  Rarely has to take 10mg  propranolol prn palpitations.   Plantar fasciitis 2017   Rotator cuff tendonitis 09/02/2012   Subclinical hypothyroidism 2017/18   TSH VERY mildly elevated.   Vitamin B12 deficiency 2010   IM therapy at one time but switched to oral in early 2012 and levels ok    Past Surgical History:  Procedure Laterality Date   ABDOMINAL HYSTERECTOMY  05/26/2006   (pathology benign) GYN no longer does any cerv ca scr.   CHOLECYSTECTOMY  04/05/02   lap chole   COLONOSCOPY  2002; 2014   Int/ext hemorr, o/w normal.  Normal repeat colonoscopy 03/16/13 by Dr. Collene Mares.  Recall 2024.   SALPINGOOPHORECTOMY  05/26/06   Right w/hysterectomy (pathology benign)   SALPINGOOPHORECTOMY  12/09/04   Left (pathology benign)   TONSILLECTOMY      Outpatient Medications Prior to Visit  Medication Sig Dispense Refill   atorvastatin (LIPITOR) 40 MG tablet TAKE 1 TABLET BY MOUTH DAILY 90 tablet 1   cyanocobalamin 2000 MCG tablet Take 2,000 mcg by mouth daily.      estradiol (ESTRACE) 1 MG tablet TAKE 1 TABLET BY MOUTH DAILY 90 tablet 1   Omega-3 Fatty Acids (FISH OIL) 1200 MG CAPS Take 2 capsules by mouth daily.  omeprazole (PRILOSEC) 40 MG capsule Take 1 capsule by mouth daily.     No facility-administered medications prior to visit.    Allergies  Allergen Reactions   Codeine Other (See Comments)    Skin crawls    Review of Systems As per HPI  PE:    12/12/2022    8:04 AM 06/11/2022    8:01 AM 08/14/2021    4:02 PM  Vitals with BMI  Height 5' 5.5" 5' 5.5" 5' 2.25"  Weight 299 lbs 302 lbs 299 lbs 13 oz  BMI 48.98 44.81 85.6  Systolic 314 97 970  Diastolic 69 61 70  Pulse 75 73 82     Physical Exam  Gen: Alert, well appearing.  Patient is oriented to person, place, time, and situation. AFFECT: pleasant, lucid thought and speech. CV: RRR, no m/r/g.   LUNGS: CTA bilat, nonlabored resps, good  aeration in all lung fields.   LABS:  Last CBC Lab Results  Component Value Date   WBC 6.8 06/11/2022   HGB 12.6 06/11/2022   HCT 38.5 06/11/2022   MCV 96.4 06/11/2022   RDW 13.9 06/11/2022   PLT 240.0 26/37/8588   Last metabolic panel Lab Results  Component Value Date   GLUCOSE 89 06/11/2022   NA 139 06/11/2022   K 4.4 06/11/2022   CL 103 06/11/2022   CO2 27 06/11/2022   BUN 17 06/11/2022   CREATININE 0.90 06/11/2022   CALCIUM 9.0 06/11/2022   PHOS 3.5 06/14/2012   PROT 6.5 06/11/2022   ALBUMIN 3.9 06/11/2022   BILITOT 0.5 06/11/2022   ALKPHOS 102 06/11/2022   AST 16 06/11/2022   ALT 19 06/11/2022   Last lipids Lab Results  Component Value Date   CHOL 193 06/11/2022   HDL 53.30 06/11/2022   LDLCALC 113 (H) 06/11/2022   LDLDIRECT 113.0 06/10/2019   TRIG 136.0 06/11/2022   CHOLHDL 4 06/11/2022   Last thyroid functions Lab Results  Component Value Date   TSH 4.23 06/11/2022   T3TOTAL 167 06/11/2022   Last vitamin B12 and Folate Lab Results  Component Value Date   VITAMINB12 826 05/22/2016   IMPRESSION AND PLAN:  #1 hypercholesterolemia.  Doing well on atorvastatin 40 mg a day. Lipid panel and hepatic panel today.  2.  Subclinical hypothyroidism. Monitoring thyroid panel today.  An After Visit Summary was printed and given to the patient.  FOLLOW UP: No follow-ups on file.  Signed:  Crissie Sickles, MD           12/12/2022

## 2022-12-13 LAB — T3: T3, Total: 179 ng/dL (ref 76–181)

## 2022-12-20 ENCOUNTER — Other Ambulatory Visit: Payer: Self-pay | Admitting: Family Medicine

## 2023-01-07 ENCOUNTER — Other Ambulatory Visit: Payer: Self-pay | Admitting: Family Medicine

## 2023-01-12 ENCOUNTER — Telehealth: Payer: Self-pay | Admitting: Family Medicine

## 2023-01-12 NOTE — Telephone Encounter (Signed)
Pt would like a call back to discuss her last lab results.

## 2023-01-12 NOTE — Telephone Encounter (Signed)
Pt is concerned about recent hair loss within the last 3 months, it has gotten worse in the last month. She did have a prior issue with b12 level but is taking otc b12 supplement daily. She would like to know next steps. Advised she may need an appt to further discuss. Pt voiced understanding.  Please review and advise

## 2023-01-12 NOTE — Telephone Encounter (Signed)
LVM for pt to return call

## 2023-02-24 ENCOUNTER — Other Ambulatory Visit: Payer: Self-pay | Admitting: Gastroenterology

## 2023-05-27 ENCOUNTER — Other Ambulatory Visit: Payer: Self-pay | Admitting: Family Medicine

## 2023-05-28 ENCOUNTER — Encounter (HOSPITAL_COMMUNITY): Payer: Self-pay | Admitting: Gastroenterology

## 2023-06-04 NOTE — Anesthesia Preprocedure Evaluation (Addendum)
Anesthesia Evaluation  Patient identified by MRN, date of birth, ID band Patient awake    Reviewed: Allergy & Precautions, H&P , NPO status , Patient's Chart, lab work & pertinent test results  Airway Mallampati: III  TM Distance: >3 FB Neck ROM: Full    Dental no notable dental hx. (+) Teeth Intact, Dental Advisory Given   Pulmonary neg pulmonary ROS   Pulmonary exam normal breath sounds clear to auscultation       Cardiovascular Exercise Tolerance: Good negative cardio ROS  Rhythm:Regular Rate:Normal     Neuro/Psych negative neurological ROS  negative psych ROS   GI/Hepatic Neg liver ROS,GERD  Medicated,,  Endo/Other  Hypothyroidism  Morbid obesity  Renal/GU negative Renal ROS  negative genitourinary   Musculoskeletal   Abdominal   Peds  Hematology negative hematology ROS (+)   Anesthesia Other Findings   Reproductive/Obstetrics negative OB ROS                             Anesthesia Physical Anesthesia Plan  ASA: 3  Anesthesia Plan: MAC   Post-op Pain Management: Minimal or no pain anticipated   Induction: Intravenous  PONV Risk Score and Plan: 2 and Propofol infusion and Treatment may vary due to age or medical condition  Airway Management Planned: Natural Airway and Simple Face Mask  Additional Equipment:   Intra-op Plan:   Post-operative Plan:   Informed Consent: I have reviewed the patients History and Physical, chart, labs and discussed the procedure including the risks, benefits and alternatives for the proposed anesthesia with the patient or authorized representative who has indicated his/her understanding and acceptance.     Dental advisory given  Plan Discussed with: CRNA  Anesthesia Plan Comments:        Anesthesia Quick Evaluation

## 2023-06-05 ENCOUNTER — Other Ambulatory Visit: Payer: Self-pay

## 2023-06-05 ENCOUNTER — Encounter (HOSPITAL_COMMUNITY)
Admission: RE | Disposition: A | Discharge status: Home / Self Care | Payer: Self-pay | Source: Home / Self Care | Attending: Gastroenterology

## 2023-06-05 ENCOUNTER — Ambulatory Visit (HOSPITAL_COMMUNITY)
Admission: RE | Admit: 2023-06-05 | Discharge: 2023-06-05 | Disposition: A | Discharge status: Home / Self Care | Payer: 59 | Attending: Gastroenterology | Admitting: Gastroenterology

## 2023-06-05 ENCOUNTER — Ambulatory Visit (HOSPITAL_COMMUNITY): Payer: 59 | Admitting: Anesthesiology

## 2023-06-05 ENCOUNTER — Encounter (HOSPITAL_COMMUNITY): Payer: Self-pay | Admitting: Gastroenterology

## 2023-06-05 DIAGNOSIS — E039 Hypothyroidism, unspecified: Secondary | ICD-10-CM | POA: Diagnosis not present

## 2023-06-05 DIAGNOSIS — Z6841 Body Mass Index (BMI) 40.0 and over, adult: Secondary | ICD-10-CM | POA: Diagnosis not present

## 2023-06-05 DIAGNOSIS — E038 Other specified hypothyroidism: Secondary | ICD-10-CM | POA: Insufficient documentation

## 2023-06-05 DIAGNOSIS — Z79899 Other long term (current) drug therapy: Secondary | ICD-10-CM | POA: Insufficient documentation

## 2023-06-05 DIAGNOSIS — E785 Hyperlipidemia, unspecified: Secondary | ICD-10-CM

## 2023-06-05 DIAGNOSIS — K219 Gastro-esophageal reflux disease without esophagitis: Secondary | ICD-10-CM | POA: Insufficient documentation

## 2023-06-05 DIAGNOSIS — Z1211 Encounter for screening for malignant neoplasm of colon: Secondary | ICD-10-CM | POA: Diagnosis present

## 2023-06-05 HISTORY — PX: COLONOSCOPY WITH PROPOFOL: SHX5780

## 2023-06-05 SURGERY — COLONOSCOPY WITH PROPOFOL
Anesthesia: Monitor Anesthesia Care

## 2023-06-05 MED ORDER — DEXMEDETOMIDINE HCL IN NACL 80 MCG/20ML IV SOLN
INTRAVENOUS | Status: DC | PRN
  Administered 2023-06-05: 8 ug via INTRAVENOUS

## 2023-06-05 MED ORDER — PROPOFOL 1000 MG/100ML IV EMUL
INTRAVENOUS | Status: AC
  Filled 2023-06-05: qty 100

## 2023-06-05 MED ORDER — PROPOFOL 500 MG/50ML IV EMUL
INTRAVENOUS | Status: DC | PRN
  Administered 2023-06-05: 120 ug/kg/min via INTRAVENOUS

## 2023-06-05 MED ORDER — SODIUM CHLORIDE 0.9 % IV SOLN: INTRAVENOUS | Status: DC

## 2023-06-05 MED ORDER — PROPOFOL 10 MG/ML IV BOLUS
INTRAVENOUS | Status: DC | PRN
Start: 2023-06-05 — End: 2023-06-05
  Administered 2023-06-05: 20 mg via INTRAVENOUS
  Administered 2023-06-05 (×3): 10 mg via INTRAVENOUS
  Administered 2023-06-05 (×2): 20 mg via INTRAVENOUS
  Administered 2023-06-05: 10 mg via INTRAVENOUS

## 2023-06-05 MED ORDER — LIDOCAINE HCL 1 % IJ SOLN
INTRAMUSCULAR | Status: DC | PRN
  Administered 2023-06-05: 40 mg via INTRADERMAL

## 2023-06-05 MED ORDER — LACTATED RINGERS IV SOLN
INTRAVENOUS | Status: AC | PRN
Start: 2023-06-05 — End: 2023-06-05
  Administered 2023-06-05: 1000 mL via INTRAVENOUS

## 2023-06-05 SURGICAL SUPPLY — 22 items
ELECT REM PT RETURN 9FT ADLT (ELECTROSURGICAL)
ELECTRODE REM PT RTRN 9FT ADLT (ELECTROSURGICAL) IMPLANT
FCP BXJMBJMB 240X2.8X (CUTTING FORCEPS)
FLOOR PAD 36X40 (MISCELLANEOUS) ×1
FORCEPS BIOP RAD 4 LRG CAP 4 (CUTTING FORCEPS) IMPLANT
FORCEPS BIOP RJ4 240 W/NDL (CUTTING FORCEPS)
FORCEPS BXJMBJMB 240X2.8X (CUTTING FORCEPS) IMPLANT
INJECTOR/SNARE I SNARE (MISCELLANEOUS) IMPLANT
LUBRICANT JELLY 4.5OZ STERILE (MISCELLANEOUS) IMPLANT
MANIFOLD NEPTUNE II (INSTRUMENTS) IMPLANT
NDL SCLEROTHERAPY 25GX240 (NEEDLE) IMPLANT
NEEDLE SCLEROTHERAPY 25GX240 (NEEDLE)
PAD FLOOR 36X40 (MISCELLANEOUS) ×1 IMPLANT
PROBE APC STR FIRE (PROBE) IMPLANT
PROBE INJECTION GOLD (MISCELLANEOUS)
PROBE INJECTION GOLD 7FR (MISCELLANEOUS) IMPLANT
SNARE ROTATE MED OVAL 20MM (MISCELLANEOUS) IMPLANT
SYR 50ML LL SCALE MARK (SYRINGE) IMPLANT
TRAP SPECIMEN MUCOUS 40CC (MISCELLANEOUS) IMPLANT
TUBING ENDO SMARTCAP PENTAX (MISCELLANEOUS) IMPLANT
TUBING IRRIGATION ENDOGATOR (MISCELLANEOUS) ×1 IMPLANT
WATER STERILE IRR 1000ML POUR (IV SOLUTION) IMPLANT

## 2023-06-05 NOTE — Discharge Instructions (Signed)
YOU HAD AN ENDOSCOPIC PROCEDURE TODAY: Refer to the procedure report and other information in the discharge instructions given to you for any specific questions about what was found during the examination. If this information does not answer your questions, please call [facility] at [phone] to clarify.   YOU SHOULD EXPECT: Some feelings of bloating in the abdomen. Passage of more gas than usual. Walking can help get rid of the air that was put into your GI tract during the procedure and reduce the bloating. If you had a lower endoscopy (such as a colonoscopy or flexible sigmoidoscopy) you may notice spotting of blood in your stool or on the toilet paper. Some abdominal soreness may be present for a day or two, also.  DIET: Your first meal following the procedure should be a light meal and then it is ok to progress to your normal diet. A half-sandwich or bowl of soup is an example of a good first meal. Heavy or fried foods are harder to digest and may make you feel nauseous or bloated. Drink plenty of fluids but you should avoid alcoholic beverages for 24 hours.  ACTIVITY: Your care partner should take you home directly after the procedure. You should plan to take it easy, moving slowly for the rest of the day. You can resume normal activity the day after the procedure however YOU SHOULD NOT DRIVE, use power tools, machinery or perform tasks that involve climbing or major physical exertion for 24 hours (because of the sedation medicines used during the test).   SYMPTOMS TO REPORT IMMEDIATELY: A gastroenterologist can be reached at any hour. Please call [phone]  for any of the following symptoms:  Following lower endoscopy (colonoscopy, flexible sigmoidoscopy) Excessive amounts of blood in the stool  Significant tenderness, worsening of abdominal pains  Swelling of the abdomen that is new, acute  Fever of 100 or higher    FOLLOW UP:  If any biopsies were taken you will be  contacted by phone or by letter within the next 1-3 weeks. Call [phone]  if you have not heard about the biopsies in 3 weeks.  Please also call with any specific questions about appointments or follow up tests.  

## 2023-06-05 NOTE — Transfer of Care (Signed)
Immediate Anesthesia Transfer of Care Note  Patient: Jodi [name]  Procedure(s) Performed: COLONOSCOPY WITH PROPOFOL  Patient Location: PACU and Endoscopy Unit  Anesthesia Type:MAC  Level of Consciousness: awake, alert , oriented, and patient cooperative  Airway & Oxygen Therapy: Patient Spontanous Breathing and Patient connected to face mask oxygen  Post-op Assessment: Report given to RN and Post -op Vital signs reviewed and stable  Post vital signs: Reviewed and stable  Last Vitals:  Vitals Value Taken Time  BP 124/59 06/05/23 0810  Temp    Pulse 77 06/05/23 0811  Resp 18 06/05/23 0811  SpO2 100 % 06/05/23 0811  Vitals shown include unfiled device data.  Last Pain:  Vitals:   06/05/23 0809  TempSrc:   PainSc: 0-No pain         Complications: No notable events documented.

## 2023-06-05 NOTE — Anesthesia Postprocedure Evaluation (Signed)
Anesthesia Post Note  Patient: Jodi [name]  Procedure(s) Performed: COLONOSCOPY WITH PROPOFOL     Patient location during evaluation: Endoscopy Anesthesia Type: MAC Level of consciousness: awake and alert Pain management: pain level controlled Vital Signs Assessment: post-procedure vital signs reviewed and stable Respiratory status: spontaneous breathing, nonlabored ventilation and respiratory function stable Cardiovascular status: stable and blood pressure returned to baseline Postop Assessment: no apparent nausea or vomiting Anesthetic complications: no  No notable events documented.  Last Vitals:  Vitals:   06/05/23 0819 06/05/23 0826  BP: 136/71 132/67  Pulse: 73 72  Resp: 16 14  Temp:    SpO2: 98% 98%    Last Pain:  Vitals:   06/05/23 0826  TempSrc:   PainSc: 0-No pain                 Toa Mia,W. EDMOND

## 2023-06-05 NOTE — H&P (Signed)
Jodi [name] HPI: This 62 year old white female presents to the office for a colorectal cancer screening. She is taking Omeprazole 40 mg per day for acid reflux with good control. She has 1-2-3 BM's per day with no obvious blood or mucus in the stool. She has good appetite and her weight has been gradually increasing over the last few years. She denies having any complaints of abdominal pain, nausea, vomiting, dysphagia or odynophagia. She denies having a family history of colon cancer, celiac sprue or IBD. Her last colonoscopy done on 03/16/2013 revealed internal hemorrhoids but the exam was otherwise normal.   Past Medical History:  Diagnosis Date   Chronic constipation    does well on 2 OTC stool softeners per night   Family history of BRCA2 gene positive    Family history of breast cancer    Medical genetics eval 03/2017: genetic screening panel to better assess/estimate her future risk of developing cancer came back "negative"--indicating that she has NO increased risk and she should be screened for approp cancers as average risk pt.(as of 04/2017).   GERD (gastroesophageal reflux disease)    w/ hiatal hernia.  EGD 4/172002 (Dr. Loreta Ave) normal biopsy of esophagus   Hemorrhoids 02/24/01   Colonoscopy; Dr. Loreta Ave   Hyperlipidemia    intolerant of simvastatin   Menopausal symptom    Dr. Henderson Cloud has her on estradiol 1mg  qd as of 10/2016   Morbid obesity (HCC)    Palpitations    ECHO 09/2010 normal.  Rarely has to take 10mg  propranolol prn palpitations.   Plantar fasciitis 2017   Rotator cuff tendonitis 09/02/2012   Subclinical hypothyroidism 2017/18   TSH VERY mildly elevated.   Vitamin B12 deficiency 2010   IM therapy at one time but switched to oral in early 2012 and levels ok    Past Surgical History:  Procedure Laterality Date   ABDOMINAL HYSTERECTOMY  05/26/2006   (pathology benign) GYN no longer does any cerv ca scr.   CHOLECYSTECTOMY  04/05/02   lap chole    COLONOSCOPY  2002; 2014   Int/ext hemorr, o/w normal.  Normal repeat colonoscopy 03/16/13 by Dr. Loreta Ave.  Recall 2024.   SALPINGOOPHORECTOMY  05/26/06   Right w/hysterectomy (pathology benign)   SALPINGOOPHORECTOMY  12/09/04   Left (pathology benign)   TONSILLECTOMY      Family History  Problem Relation Age of Onset   Diabetes Mother    Heart disease Mother        CABG   Hyperlipidemia Mother    Hypertension Mother    Cancer Father        lung   Alcohol abuse Father    Melanoma Father 93       on his nose   Heart disease Maternal Grandmother    Hyperlipidemia Maternal Grandmother    Hypertension Maternal Grandmother    Stroke Maternal Grandmother    Diabetes Maternal Grandmother    Lung cancer Maternal Uncle    Lung cancer Paternal Uncle    Heart disease Maternal Grandfather    Lung cancer Paternal Grandfather    Pancreatic cancer Paternal Uncle        died in his 7s   Breast cancer Cousin        died at 10   BRCA 1/2 Cousin        BRCA2 pos   Breast cancer Cousin 35       paternal first cousin's daughter   BRCA 1/2 Cousin  BRCA2 pos    Social History:  reports that she has never smoked. She has never used smokeless tobacco. She reports current alcohol use. She reports that she does not use drugs.  Allergies:  Allergies  Allergen Reactions   Codeine Other (See Comments)    Skin crawls    Medications: Scheduled: Continuous:  sodium chloride     lactated ringers 10 mL/hr at 06/05/23 0711    No results found for this or any previous visit (from the past 24 hour(s)).   No results found.  ROS:  As stated above in the HPI otherwise negative.  Blood pressure (!) 169/72, pulse 80, temperature (!) 97 F (36.1 C), temperature source Tympanic, resp. rate 17, height 5\' 5"  (1.651 m), weight 131.5 kg, last menstrual period 05/10/2006.    PE: Gen: NAD, Alert and Oriented HEENT:  [place]/AT, EOMI Neck: Supple, no LAD Lungs: CTA Bilaterally CV: RRR without  M/G/R ABD: Soft, NTND, +BS Ext: No C/C/E  Assessment/Plan: 1) Screening colonoscopy.  [name] D 06/05/2023, 7:21 AM

## 2023-06-05 NOTE — Op Note (Signed)
[facility] Patient Name: Jodi [name] Procedure Date: 06/05/2023 MRN: [id number] Attending MD: Jeani Hawking , MD, [id number] Date of Birth: [birth year] CSN: [id number] Age: 62 Admit Type: Outpatient Procedure:                Colonoscopy Indications:              Screening for colorectal malignant neoplasm Providers:                Jeani Hawking, MD, Stephens Shire RN, RN, Cephus Richer, RN, Kandice Robinsons, Technician Referring MD:             Jeani Hawking, MD Medicines:                Propofol per Anesthesia Complications:            No immediate complications. Estimated Blood Loss:     Estimated blood loss: none. Procedure:                Pre-Anesthesia Assessment:                           - Prior to the procedure, a History and Physical                            was performed, and patient medications and                            allergies were reviewed. The patient's tolerance of                            previous anesthesia was also reviewed. The risks                            and benefits of the procedure and the sedation                            options and risks were discussed with the patient.                            All questions were answered, and informed consent                            was obtained. Prior Anticoagulants: The patient has                            taken no anticoagulant or antiplatelet agents. ASA                            Grade Assessment: III - A patient with severe                            systemic disease. After reviewing the risks and  benefits, the patient was deemed in satisfactory                            condition to undergo the procedure.                           - Sedation was administered by an anesthesia                            professional. Deep sedation was attained.                           After obtaining informed consent, the colonoscope                             was passed under direct vision. Throughout the                            procedure, the patient's blood pressure, pulse, and                            oxygen saturations were monitored continuously. The                            CF-HQ190L ([id number]) Olympus colonoscope was                            introduced through the anus and advanced to the the                            cecum, identified by appendiceal orifice and                            ileocecal valve. The colonoscopy was somewhat                            difficult due to significant looping. Successful                            completion of the procedure was aided by using                            manual pressure and straightening and shortening                            the scope to obtain bowel loop reduction. The                            patient tolerated the procedure well. The quality                            of the bowel preparation was evaluated using the  BBPS [facility] Bowel Preparation Scale) with scores                            of: Right Colon = 3, Transverse Colon = 3 and Left                            Colon = 3 (entire mucosa seen well with no residual                            staining, small fragments of stool or opaque                            liquid). The total BBPS score equals 9. The                            ileocecal valve, appendiceal orifice, and rectum                            were photographed. Scope In: 7:46:10 AM Scope Out: 8:02:11 AM Scope Withdrawal Time: 0 hours 8 minutes 24 seconds  Total Procedure Duration: 0 hours 16 minutes 1 second  Findings:      The entire examined colon appeared normal. Impression:               - The entire examined colon is normal.                           - No specimens collected. Moderate Sedation:      Not Applicable - Patient had care per Anesthesia. Recommendation:           - Patient has a contact  number available for                            emergencies. The signs and symptoms of potential                            delayed complications were discussed with the                            patient. Return to normal activities tomorrow.                            Written discharge instructions were provided to the                            patient.                           - Resume previous diet.                           - Continue present medications.                           - Repeat colonoscopy in 10 years for screening  purposes. Procedure Code(s):        --- Professional ---                           [phone], Colonoscopy, flexible; diagnostic, including                            collection of specimen(s) by brushing or washing,                            when performed (separate procedure) Diagnosis Code(s):        --- Professional ---                           Z12.11, Encounter for screening for malignant                            neoplasm of colon CPT copyright 2022 [employer]. All rights reserved. The codes documented in this report are preliminary and upon coder review may  be revised to meet current compliance requirements. Jeani Hawking, MD Jeani Hawking, MD 06/05/2023 8:10:51 AM This report has been signed electronically. Number of Addenda: 0

## 2023-06-08 ENCOUNTER — Encounter (HOSPITAL_COMMUNITY): Payer: Self-pay | Admitting: Gastroenterology

## 2023-06-12 ENCOUNTER — Encounter: Payer: 59 | Admitting: Family Medicine

## 2023-06-16 ENCOUNTER — Other Ambulatory Visit: Payer: Self-pay | Admitting: Family Medicine

## 2023-06-22 ENCOUNTER — Other Ambulatory Visit: Payer: Self-pay | Admitting: Family Medicine

## 2023-06-22 DIAGNOSIS — Z1231 Encounter for screening mammogram for malignant neoplasm of breast: Secondary | ICD-10-CM

## 2023-07-15 ENCOUNTER — Ambulatory Visit
Admission: RE | Admit: 2023-07-15 | Discharge: 2023-07-15 | Disposition: A | Discharge status: Home / Self Care | Payer: 59 | Source: Ambulatory Visit | Attending: Family Medicine | Admitting: Family Medicine

## 2023-07-15 DIAGNOSIS — Z1231 Encounter for screening mammogram for malignant neoplasm of breast: Secondary | ICD-10-CM

## 2024-06-02 ENCOUNTER — Other Ambulatory Visit: Payer: Self-pay | Admitting: Family Medicine

## 2024-06-02 DIAGNOSIS — Z1231 Encounter for screening mammogram for malignant neoplasm of breast: Secondary | ICD-10-CM

## 2024-07-15 ENCOUNTER — Ambulatory Visit
Admission: RE | Admit: 2024-07-15 | Discharge: 2024-07-15 | Disposition: A | Discharge status: Home / Self Care | Source: Ambulatory Visit | Attending: Family Medicine | Admitting: Family Medicine

## 2024-07-15 DIAGNOSIS — Z1231 Encounter for screening mammogram for malignant neoplasm of breast: Secondary | ICD-10-CM
